# Patient Record
Sex: Female | Born: 1955 | Race: White | Hispanic: No | Marital: Married | Smoking: Former smoker
Health system: Southern US, Community
[De-identification: ages and names within clinical notes are randomized; demographics above are authoritative.]

## PROBLEM LIST (undated history)

## (undated) DIAGNOSIS — N809 Endometriosis, unspecified: Secondary | ICD-10-CM

## (undated) DIAGNOSIS — R87619 Unspecified abnormal cytological findings in specimens from cervix uteri: Secondary | ICD-10-CM

## (undated) DIAGNOSIS — I10 Essential (primary) hypertension: Secondary | ICD-10-CM

## (undated) DIAGNOSIS — N8003 Adenomyosis of the uterus: Secondary | ICD-10-CM

## (undated) DIAGNOSIS — E785 Hyperlipidemia, unspecified: Secondary | ICD-10-CM

## (undated) DIAGNOSIS — K219 Gastro-esophageal reflux disease without esophagitis: Secondary | ICD-10-CM

## (undated) DIAGNOSIS — D649 Anemia, unspecified: Secondary | ICD-10-CM

## (undated) DIAGNOSIS — IMO0002 Reserved for concepts with insufficient information to code with codable children: Secondary | ICD-10-CM

## (undated) DIAGNOSIS — M1990 Unspecified osteoarthritis, unspecified site: Secondary | ICD-10-CM

## (undated) DIAGNOSIS — K5790 Diverticulosis of intestine, part unspecified, without perforation or abscess without bleeding: Secondary | ICD-10-CM

## (undated) DIAGNOSIS — R079 Chest pain, unspecified: Secondary | ICD-10-CM

## (undated) DIAGNOSIS — Z9889 Other specified postprocedural states: Secondary | ICD-10-CM

## (undated) DIAGNOSIS — R112 Nausea with vomiting, unspecified: Secondary | ICD-10-CM

## (undated) DIAGNOSIS — N800 Endometriosis of uterus: Secondary | ICD-10-CM

## (undated) DIAGNOSIS — E119 Type 2 diabetes mellitus without complications: Secondary | ICD-10-CM

## (undated) DIAGNOSIS — C801 Malignant (primary) neoplasm, unspecified: Secondary | ICD-10-CM

## (undated) DIAGNOSIS — R32 Unspecified urinary incontinence: Secondary | ICD-10-CM

## (undated) DIAGNOSIS — G709 Myoneural disorder, unspecified: Secondary | ICD-10-CM

## (undated) DIAGNOSIS — T7840XA Allergy, unspecified, initial encounter: Secondary | ICD-10-CM

## (undated) HISTORY — PX: NOSE SURGERY: SHX723

## (undated) HISTORY — DX: Nausea with vomiting, unspecified: R11.2

## (undated) HISTORY — DX: Type 2 diabetes mellitus without complications: E11.9

## (undated) HISTORY — DX: Malignant (primary) neoplasm, unspecified: C80.1

## (undated) HISTORY — DX: Adenomyosis of the uterus: N80.03

## (undated) HISTORY — PX: COLONOSCOPY: SHX174

## (undated) HISTORY — DX: Endometriosis of uterus: N80.0

## (undated) HISTORY — DX: Endometriosis, unspecified: N80.9

## (undated) HISTORY — DX: Gastro-esophageal reflux disease without esophagitis: K21.9

## (undated) HISTORY — DX: Reserved for concepts with insufficient information to code with codable children: IMO0002

## (undated) HISTORY — DX: Allergy, unspecified, initial encounter: T78.40XA

## (undated) HISTORY — DX: Myoneural disorder, unspecified: G70.9

## (undated) HISTORY — DX: Unspecified urinary incontinence: R32

## (undated) HISTORY — DX: Hyperlipidemia, unspecified: E78.5

## (undated) HISTORY — PX: TUMOR REMOVAL: SHX12

## (undated) HISTORY — DX: Diverticulosis of intestine, part unspecified, without perforation or abscess without bleeding: K57.90

## (undated) HISTORY — DX: Other specified postprocedural states: Z98.890

## (undated) HISTORY — DX: Essential (primary) hypertension: I10

## (undated) HISTORY — DX: Unspecified abnormal cytological findings in specimens from cervix uteri: R87.619

## (undated) HISTORY — DX: Unspecified osteoarthritis, unspecified site: M19.90

## (undated) HISTORY — DX: Chest pain, unspecified: R07.9

---
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
## 1977-06-21 HISTORY — PX: TUMOR REMOVAL: SHX12

## 1994-06-21 HISTORY — PX: TUBAL LIGATION: SHX77

## 1998-09-17 ENCOUNTER — Other Ambulatory Visit: Admission: RE | Admit: 1998-09-17 | Discharge: 1998-09-17 | Payer: Self-pay | Admitting: *Deleted

## 1999-09-17 ENCOUNTER — Other Ambulatory Visit: Admission: RE | Admit: 1999-09-17 | Discharge: 1999-09-17 | Payer: Self-pay | Admitting: *Deleted

## 1999-09-24 ENCOUNTER — Ambulatory Visit (HOSPITAL_COMMUNITY): Admission: RE | Admit: 1999-09-24 | Discharge: 1999-09-24 | Payer: Self-pay | Admitting: *Deleted

## 2001-06-15 ENCOUNTER — Other Ambulatory Visit: Admission: RE | Admit: 2001-06-15 | Discharge: 2001-06-15 | Payer: Self-pay | Admitting: Gynecology

## 2002-11-30 ENCOUNTER — Encounter: Payer: Self-pay | Admitting: Gynecology

## 2002-11-30 ENCOUNTER — Ambulatory Visit (HOSPITAL_COMMUNITY): Admission: RE | Admit: 2002-11-30 | Discharge: 2002-11-30 | Payer: Self-pay | Admitting: Gynecology

## 2004-03-18 ENCOUNTER — Other Ambulatory Visit: Admission: RE | Admit: 2004-03-18 | Discharge: 2004-03-18 | Payer: Self-pay | Admitting: Gynecology

## 2004-05-21 HISTORY — PX: VAGINAL HYSTERECTOMY: SUR661

## 2004-05-26 ENCOUNTER — Encounter (INDEPENDENT_AMBULATORY_CARE_PROVIDER_SITE_OTHER): Payer: Self-pay | Admitting: Specialist

## 2004-05-26 ENCOUNTER — Observation Stay (HOSPITAL_COMMUNITY): Admission: RE | Admit: 2004-05-26 | Discharge: 2004-05-27 | Payer: Self-pay | Admitting: Gynecology

## 2006-02-25 ENCOUNTER — Ambulatory Visit (HOSPITAL_COMMUNITY): Admission: RE | Admit: 2006-02-25 | Discharge: 2006-02-25 | Payer: Self-pay | Admitting: *Deleted

## 2010-02-14 ENCOUNTER — Inpatient Hospital Stay (HOSPITAL_COMMUNITY): Admission: EM | Admit: 2010-02-14 | Discharge: 2010-02-17 | Payer: Self-pay | Admitting: Emergency Medicine

## 2010-02-16 HISTORY — PX: CARDIAC CATHETERIZATION: SHX172

## 2010-09-04 LAB — CBC
HCT: 37.6 % (ref 36.0–46.0)
HCT: 38.4 % (ref 36.0–46.0)
HCT: 40.6 % (ref 36.0–46.0)
HCT: 40.9 % (ref 36.0–46.0)
Hemoglobin: 12.7 g/dL (ref 12.0–15.0)
Hemoglobin: 12.8 g/dL (ref 12.0–15.0)
Hemoglobin: 13.6 g/dL (ref 12.0–15.0)
Hemoglobin: 13.7 g/dL (ref 12.0–15.0)
MCH: 28 pg (ref 26.0–34.0)
MCH: 28.1 pg (ref 26.0–34.0)
MCH: 28.2 pg (ref 26.0–34.0)
MCH: 29 pg (ref 26.0–34.0)
MCHC: 33.3 g/dL (ref 30.0–36.0)
MCHC: 33.3 g/dL (ref 30.0–36.0)
MCHC: 33.7 g/dL (ref 30.0–36.0)
MCHC: 33.8 g/dL (ref 30.0–36.0)
MCV: 83.6 fL (ref 78.0–100.0)
MCV: 84.2 fL (ref 78.0–100.0)
MCV: 84.4 fL (ref 78.0–100.0)
MCV: 85.8 fL (ref 78.0–100.0)
Platelets: 234 10*3/uL (ref 150–400)
Platelets: 238 10*3/uL (ref 150–400)
Platelets: 241 10*3/uL (ref 150–400)
Platelets: 266 10*3/uL (ref 150–400)
RBC: 4.5 MIL/uL (ref 3.87–5.11)
RBC: 4.55 MIL/uL (ref 3.87–5.11)
RBC: 4.73 MIL/uL (ref 3.87–5.11)
RBC: 4.86 MIL/uL (ref 3.87–5.11)
RDW: 13.1 % (ref 11.5–15.5)
RDW: 13.1 % (ref 11.5–15.5)
RDW: 13.1 % (ref 11.5–15.5)
RDW: 13.2 % (ref 11.5–15.5)
WBC: 10.5 10*3/uL (ref 4.0–10.5)
WBC: 8.8 10*3/uL (ref 4.0–10.5)
WBC: 8.8 10*3/uL (ref 4.0–10.5)
WBC: 9.5 10*3/uL (ref 4.0–10.5)

## 2010-09-04 LAB — BASIC METABOLIC PANEL
BUN: 11 mg/dL (ref 6–23)
BUN: 14 mg/dL (ref 6–23)
BUN: 15 mg/dL (ref 6–23)
CO2: 24 mEq/L (ref 19–32)
CO2: 28 mEq/L (ref 19–32)
CO2: 28 mEq/L (ref 19–32)
Calcium: 9.1 mg/dL (ref 8.4–10.5)
Calcium: 9.1 mg/dL (ref 8.4–10.5)
Calcium: 9.3 mg/dL (ref 8.4–10.5)
Chloride: 105 mEq/L (ref 96–112)
Chloride: 106 mEq/L (ref 96–112)
Chloride: 107 mEq/L (ref 96–112)
Creatinine, Ser: 0.82 mg/dL (ref 0.4–1.2)
Creatinine, Ser: 0.87 mg/dL (ref 0.4–1.2)
Creatinine, Ser: 1 mg/dL (ref 0.4–1.2)
GFR calc Af Amer: >60 mL/min (ref >60)
GFR calc Af Amer: >60 mL/min (ref >60)
GFR calc Af Amer: >60 mL/min (ref >60)
GFR calc non Af Amer: 58 mL/min — ABNORMAL LOW (ref >60)
GFR calc non Af Amer: >60 mL/min (ref >60)
GFR calc non Af Amer: >60 mL/min (ref >60)
Glucose, Bld: 147 mg/dL — ABNORMAL HIGH (ref 70–99)
Glucose, Bld: 149 mg/dL — ABNORMAL HIGH (ref 70–99)
Glucose, Bld: 223 mg/dL — ABNORMAL HIGH (ref 70–99)
Potassium: 3.6 mEq/L (ref 3.5–5.1)
Potassium: 4.1 mEq/L (ref 3.5–5.1)
Potassium: 4.2 mEq/L (ref 3.5–5.1)
Sodium: 138 mEq/L (ref 135–145)
Sodium: 139 mEq/L (ref 135–145)
Sodium: 140 mEq/L (ref 135–145)

## 2010-09-04 LAB — LIPID PANEL
Cholesterol: 215 mg/dL — ABNORMAL HIGH (ref 0–200)
HDL: 50 mg/dL (ref >39)
LDL Cholesterol: 133 mg/dL — ABNORMAL HIGH (ref 0–99)
Total CHOL/HDL Ratio: 4.3 RATIO
Triglycerides: 158 mg/dL — ABNORMAL HIGH (ref <150)
VLDL: 32 mg/dL (ref 0–40)

## 2010-09-04 LAB — DIFFERENTIAL
Basophils Absolute: 0.1 10*3/uL (ref 0.0–0.1)
Basophils Relative: 1 % (ref 0–1)
Eosinophils Absolute: 0.2 10*3/uL (ref 0.0–0.7)
Eosinophils Relative: 2 % (ref 0–5)
Lymphocytes Relative: 36 % (ref 12–46)
Lymphs Abs: 3.4 10*3/uL (ref 0.7–4.0)
Monocytes Absolute: 0.8 10*3/uL (ref 0.1–1.0)
Monocytes Relative: 8 % (ref 3–12)
Neutro Abs: 5 10*3/uL (ref 1.7–7.7)
Neutrophils Relative %: 53 % (ref 43–77)

## 2010-09-04 LAB — BRAIN NATRIURETIC PEPTIDE: Pro B Natriuretic peptide (BNP): <30 pg/mL (ref 0.0–100.0)

## 2010-09-04 LAB — CK TOTAL AND CKMB (NOT AT ARMC)
CK, MB: 3.4 ng/mL (ref 0.3–4.0)
Relative Index: 2 (ref 0.0–2.5)
Total CK: 166 U/L (ref 7–177)

## 2010-09-04 LAB — GLUCOSE, CAPILLARY
Glucose-Capillary: 150 mg/dL — ABNORMAL HIGH (ref 70–99)
Glucose-Capillary: 158 mg/dL — ABNORMAL HIGH (ref 70–99)
Glucose-Capillary: 160 mg/dL — ABNORMAL HIGH (ref 70–99)
Glucose-Capillary: 166 mg/dL — ABNORMAL HIGH (ref 70–99)
Glucose-Capillary: 188 mg/dL — ABNORMAL HIGH (ref 70–99)
Glucose-Capillary: 214 mg/dL — ABNORMAL HIGH (ref 70–99)
Glucose-Capillary: 228 mg/dL — ABNORMAL HIGH (ref 70–99)
Glucose-Capillary: 312 mg/dL — ABNORMAL HIGH (ref 70–99)

## 2010-09-04 LAB — CARDIAC PANEL(CRET KIN+CKTOT+MB+TROPI)
CK, MB: 3.1 ng/mL (ref 0.3–4.0)
CK, MB: 3.3 ng/mL (ref 0.3–4.0)
Relative Index: 1.9 (ref 0.0–2.5)
Relative Index: 2 (ref 0.0–2.5)
Total CK: 156 U/L (ref 7–177)
Total CK: 174 U/L (ref 7–177)
Troponin I: 0.01 ng/mL (ref 0.00–0.06)
Troponin I: <0.01 ng/mL (ref 0.00–0.06)

## 2010-09-04 LAB — POCT CARDIAC MARKERS
CKMB, poc: 1.8 ng/mL (ref 1.0–8.0)
Myoglobin, poc: 62.5 ng/mL (ref 12–200)
Troponin i, poc: <0.05 ng/mL (ref 0.00–0.09)

## 2010-09-04 LAB — TSH: TSH: 3.447 u[IU]/mL (ref 0.350–4.500)

## 2010-09-04 LAB — HEMOGLOBIN A1C
Hgb A1c MFr Bld: 8 % — ABNORMAL HIGH (ref <5.7)
Mean Plasma Glucose: 183 mg/dL — ABNORMAL HIGH (ref <117)

## 2010-09-04 LAB — HEPARIN LEVEL (UNFRACTIONATED)
Heparin Unfractionated: 0.24 IU/mL — ABNORMAL LOW (ref 0.30–0.70)
Heparin Unfractionated: 0.34 IU/mL (ref 0.30–0.70)
Heparin Unfractionated: 0.35 IU/mL (ref 0.30–0.70)

## 2010-09-04 LAB — TROPONIN I: Troponin I: 0.01 ng/mL (ref 0.00–0.06)

## 2010-09-04 LAB — MRSA PCR SCREENING: MRSA by PCR: NEGATIVE

## 2010-09-04 LAB — D-DIMER, QUANTITATIVE: D-Dimer, Quant: 0.29 ug/mL-FEU (ref 0.00–0.48)

## 2010-11-06 NOTE — H&P (Signed)
[name], [name]           ACCOUNT NO.:  [id number]   MEDICAL RECORD NO.:  [id number]          PATIENT TYPE:  AMB   LOCATION:  DAY                          FACILITY:  [facility]   PHYSICIAN:  Gretta Cool, M.D. DATE OF BIRTH:  03-13-1956   DATE OF ADMISSION:  DATE OF DISCHARGE:                                HISTORY & PHYSICAL   CHIEF COMPLAINT:  Abnormal uterine bleeding and cyclic pelvic pain.   HISTORY OF PRESENT ILLNESS:  Ms. Payne is a 55 year old G2 P2,  perimenopausal but not on hormone-replacement therapy.  She has abnormal  uterine bleeding that is increasingly severe.  She has cyclic menses but  bleeding that goes on for a prolonged period of time and very heavy bleeding  the first couple of days of cycle.  She also has a history of tubal  sterilization and has had increasingly severe cyclic pelvic pain since her  tubal sterilization process.  Tubal in 1996, Dr. Roberto Scales, unknown type.  She  has had ultrasound examination of her endometrial cavity that revealed  multiple large cystic areas in the sub endometrial myometrium extending all  the way to the serosa suggestive of extensive adenomyosis.  She has no  evidence of luminal pathology.  She has had endometrial sampling that  revealed proliferative endometrium, no evidence of hyperplasia or more.  She  is now admitted for definitive therapy unwilling to tolerate her symptoms  any longer.  She has failed conservative management and wishes definitive  end to the problem.  We have discussed laparoscopy-assisted hysterectomy,  possible laparotomy, possible total abdominal hysterectomy, bilateral  salpingo-oophorectomy.  She understands the risks, benefits, and alternative  therapies and all.   PAST MEDICAL HISTORY:  Usual childhood disease without sequela.   MEDICAL ILLNESSES:  None.   CHILDHOOD DISEASES:  None.   MEDICAL CONDITIONS:  None.   PREVIOUS HOSPITALIZATIONS:  For tumor removed by Dr. Roberto Scales in 1979,  unknown  site.  Nasal surgery in 1988 Dr. Bernarda Caffey, tubal sterilization in 1996 Dr.  Roberto Scales.   HABITS:  Denies ethanol. Denies tobacco.  Occasional ethanol.   FAMILY HISTORY:  Father died at age 14 of heart concerns, was also  alcoholic.  Mom has emphysema.  No other known familial tendency.  Maternal  grandmother had stomach cancer.   REVIEW OF SYSTEMS:  HEENT:  Denies symptoms.  CARDIORESPIRATORY:  Denies  asthma, cough, bronchitis, stridor or shortness of breath.  GASTROINTESTINAL/GENITOURINARY:  Denies frequency, urgency, dysuria, change  in bowel habits, food intolerance.   PHYSICAL EXAMINATION:  GENERAL:  Well-developed, well-nourished white  female.  HEENT:  Pupils equal, round, reactive to light and accommodation.  Fundi  benign.  Oropharynx clear.  NECK:  Supple without masses or thyroid enlargement.  CHEST:  Clear to percussion and auscultation.  BREASTS:  Without masses, nodes, nipple discharge.  HEART:  A regular rhythm without murmur or cardiac enlargement.  ABDOMEN:  Soft, scaphoid without mass or organomegaly.  PELVIC:  External genitalia normal female.  Vagina:  Clean, rugose.  Cervix  is parous, clean.  Uterus:  Normal size, shape, contour.  Tender to  manipulation.  Adnexa clear.  Rectovaginal:  Confirms.   IMPRESSION:  1.  Abnormal uterine bleeding, probably secondary to adenomyosis.  2.  Cyclic pain secondary to endometriosis or to tubal sterilization with      utero-peritoneal fistula versus adenomyosis.  3.  Depression.  4.  Hypertension.   PLAN:  I have discussed all pertinent options and therapies.  The patient  has chosen laparoscopically-assisted vaginal hysterectomy, possible salpingo-  oophorectomy.  She understands the risks, benefits, and alternative  therapies all.  She is now admitted for definitive therapy.      CWL/MEDQ  D:  05/25/2004  T:  05/25/2004  Job:  [id number]   cc:   Juline Patch, M.D.  [address]  [place], [place]  [address]  Fax: [phone]

## 2010-11-06 NOTE — Op Note (Signed)
[name], [name] NO.:  [id number]   MEDICAL RECORD NO.:  [id number]          PATIENT TYPE:  OBV   LOCATION:  0456                         FACILITY:  [facility]   PHYSICIAN:  Gretta Cool, M.D. DATE OF BIRTH:  03/05/1956   DATE OF PROCEDURE:  05/26/2004  DATE OF DISCHARGE:                                 OPERATIVE REPORT   PREOPERATIVE DIAGNOSES:  1.  Incapacitating cyclic pain.  2.  Abnormal uterine bleeding.   POSTOPERATIVE DIAGNOSES:  1.  Incapacitating cyclic pain.  2.  Abnormal uterine bleeding.   PROCEDURE:  Laparoscopy assisted hysterectomy and bilateral salpingo-  oophorectomy, repair of enterocele.   SURGEON:  Gretta Cool, M.D.   ASSISTANT:  Raynald Kemp, M.D.   ANESTHESIA:  General oral tracheal.   DESCRIPTION OF PROCEDURE:  Under excellent general anesthesia with the  patient prepped and draped in lithotomy position in Beattie stirrups with her  bladder drained by a Foley catheter, a subumbilical incision was made.  After adequate pneumoperitoneum with carbon dioxide, the laparoscope and  trocar was introduced and pelvic organs visualized.  There was a great deal  of intraabdominal fat and visualization of the uterus and ovaries was  difficult. The right ovary was virtually absent, vestigial looking, smaller  than a pea in size. The left ovary had evidence of fairly recent activity  but was diminished in size.  Examination of the tubes revealed previous  tubal sterilization near the cornual area of the uterus. There was no  significant evidence of endometriosis or other cause for cyclic pelvic pain.  At this point, accessory ports were placed and the pelvic organs were  manipulated. The uterus was manipulated side to side to allow gyrus tripolar  forceps to be used to cauterize the infundibulopelvic vessels. The vessels  were then transected. The round was then likewise treated by cautery and  then transection. The transection was  carried down to the level of the  uterine vessels on each side. At this point, attention was turned to the  vaginal portion of the procedure. The patient was repositioned and the  cervix grasped with a single tooth tenaculum.  It was then infiltrated with  Xylocaine 1% with epinephrine 1:200,000. The mucosa was then incised and the  bladder pushed back off the lower segment. The cul-de-sac was then entered  and the uterosacral and cardinal ligaments then progressively clamped, cut,  sutured and tied with #0 Vicryl.  At this point, the vesicovaginal plica was  opened and a Deaver placed beneath the bladder. The uterine vessels were  then clamped, cut, sutured and tied with #0 Vicryl. At this point, the upper  portion of the laparoscopic procedure was communicated to the vaginal  portion of the procedure and the pedicle tied by #0 Vicryl.  At this point,  the uterus was removed along with the vestigial right ovary and left ovary  and tube. At this point, the pelvis is irrigated with lactated Ringer's.  A  cardinal uterosacral colposuspension was performed using 2-0 Novofil.  The  fascial defect of the posterior vaginal wall was then reapproximated to the  uterosacral cardinal plication so as to elevate the fascial detachment and  to bring the entire layer of fascia back to the level of the closure at the  anterior vaginal wall. At this point, the vaginal cuff was closed with a  subcuticular closure of #0 Vicryl from angle to angle with running suture of  #0 Vicryl.  At this point, the sponge and lap counts are correct, there are  no complications. The vaginal portion of the procedure was terminated and  attention again turned to the abdominal procedure. The pedicles were  visualized, there was no bleeding.  The pelvis is irrigated with lactated  Ringer's and the gas then allowed to escape. The incision was closed with  deep suture of #0 Vicryl UR-6 and subcuticular 5-0 Vicryl, skin closure  with  Steri-Strips.  At the end of the procedure, sponge and lap counts were  correct with no complications.  The patient returned to the recovery room in  excellent condition.      CWL/MEDQ  D:  05/26/2004  T:  05/26/2004  Job:  [id number]

## 2010-11-06 NOTE — Op Note (Signed)
NAMEKYNZLI, REASE NO.:  [id number]   MEDICAL RECORD NO.:  [id number]          PATIENT TYPE:  AMB   LOCATION:  ENDO                         FACILITY:  [facility]   PHYSICIAN:  Georgiana Spinner, M.D.    DATE OF BIRTH:  [birth year]   DATE OF PROCEDURE:  02/25/2006  DATE OF DISCHARGE:                                 OPERATIVE REPORT   PROCEDURE:  Colonoscopy.   INDICATIONS:  Colon cancer screening.  The patient's insurance company,  [facility] refused to cover endoscopy for Barrett's esophagus  screening.   ANESTHESIA:  Fentanyl 50 mcg, Versed 15 mg.   PROCEDURE:  With the patient mildly sedated in the left lateral decubitus  position,  the Olympus videoscopic colonoscope was inserted into the rectum,  passed under direct vision to the cecum identified by appendiceal orifice  and ileocecal valve both of which were photographed.  From this point the  colonoscope was then slowly withdrawn taking circumferential views of  colonic mucosa stopping only in the rectum which appeared normal on direct  showed hemorrhoids on retroflexed view the endoscope was straightened and  withdrawn.  The patient's vital signs, pulse oximeter remained stable.  The  patient tolerated procedure well without apparent complication.   FINDINGS:  Internal hemorrhoids otherwise an unremarkable colonoscopic  examination to the cecum.   PLAN:  Consider repeat examination in 5-10 years           ______________________________  Georgiana Spinner, M.D.     GMO/MEDQ  D:  02/25/2006  T:  02/25/2006  Job:  [id number]

## 2012-08-22 ENCOUNTER — Encounter: Payer: Self-pay | Admitting: *Deleted

## 2012-09-18 ENCOUNTER — Encounter: Payer: Self-pay | Admitting: Cardiovascular Disease

## 2014-03-19 ENCOUNTER — Ambulatory Visit (INDEPENDENT_AMBULATORY_CARE_PROVIDER_SITE_OTHER): Payer: Managed Care, Other (non HMO) | Admitting: Certified Nurse Midwife

## 2014-03-19 ENCOUNTER — Encounter: Payer: Self-pay | Admitting: Certified Nurse Midwife

## 2014-03-19 VITALS — BP 100/78 | HR 70 | Resp 16 | Ht 65.0 in | Wt 213.0 lb

## 2014-03-19 DIAGNOSIS — N762 Acute vulvitis: Secondary | ICD-10-CM

## 2014-03-19 DIAGNOSIS — Z01419 Encounter for gynecological examination (general) (routine) without abnormal findings: Secondary | ICD-10-CM

## 2014-03-19 DIAGNOSIS — Z0000 Encounter for general adult medical examination without abnormal findings: Secondary | ICD-10-CM

## 2014-03-19 DIAGNOSIS — B3731 Acute candidiasis of vulva and vagina: Secondary | ICD-10-CM

## 2014-03-19 DIAGNOSIS — B373 Candidiasis of vulva and vagina: Secondary | ICD-10-CM

## 2014-03-19 DIAGNOSIS — N760 Acute vaginitis: Secondary | ICD-10-CM

## 2014-03-19 MED ORDER — FLUCONAZOLE 150 MG PO TABS: 150.0000 mg | ORAL_TABLET | Freq: Once | ORAL | Status: DC

## 2014-03-19 MED ORDER — NYSTATIN-TRIAMCINOLONE 100000-0.1 UNIT/GM-% EX OINT: 1.0000 "application " | TOPICAL_OINTMENT | Freq: Two times a day (BID) | CUTANEOUS | Status: DC

## 2014-03-19 NOTE — Progress Notes (Signed)
58 y.o. [id number] Married Caucasian Fe here to establish gyn care and  for annual exam. Menopausal no hot flashes or night sweats. Patient having vaginal dryness for the past 2 years which has increased. Notes blood occasionally with sexual activity. Family history of cardiac bypass(sister) patient had cardiac catheterization which was normal. Patient having some vaginal and vulva issues that sometimes causing burning with urination for the past few weeks. No urinary frequency or pain urination. Denies back pain or blood in urine. History of total vaginal hysterectomy including ovaries which per patient were attached to uterus due to endometrosis. Patient denies history of HSV, except oral and spouse has same. No outbreaks in genital area ever. Patient is Type 2 Diabetic on oral medication and hypertension controlled with medication with Dr. Minna Antis. Visit due with Dr.Pang. No other health issues today  Patient's last menstrual period was 05/21/2004.          Sexually active: Yes.    The current method of family planning is status post hysterectomy.    Exercising: Yes.    walking Smoker:  no  Health Maintenance: Pap:  2006 Abnormal yrs ago MMG: 2010 over due does SBE Colonoscopy:  2007 negative 10 years PCP manages BMD:   2005 normal per patient TDaP: ? UPTD Labs: Poct urine-neg Self breast exam: done occ   reports that she has quit smoking. She does not have any smokeless tobacco history on file. She reports that she drinks about .6 ounces of alcohol per week. She reports that she does not use illicit drugs.  Past Medical History  Diagnosis Date  . DM (diabetes mellitus)   . Hypertension   . Hyperlipemia   . Chest pain   . Abnormal Pap smear of cervix     yrs ago  . Dyspareunia   . Endometriosis   . Adenomyosis   . Urinary incontinence     Past Surgical History  Procedure Laterality Date  . Cardiac catheterization  02/16/10    normal coronaries, normal LV function,EF >55%  . Vaginal  hysterectomy  12/05  . Tubal ligation  1996  . Tumor removal      ovary  . Nose surgery      Current Outpatient Prescriptions  Medication Sig Dispense Refill  . aspirin 81 MG tablet Take 81 mg by mouth as needed.       . metoprolol succinate (TOPROL-XL) 50 MG 24 hr tablet Take 50 mg by mouth daily. Take with or immediately following a meal.      . sitaGLIPtin-metformin (JANUMET) 50-1000 MG per tablet Take 1 tablet by mouth 2 (two) times daily with a meal.       No current facility-administered medications for this visit.    Family History  Problem Relation Age of Onset  . Heart attack Father   . Hyperlipidemia Father   . Diabetes Sister   . Hypertension Sister   . Hyperlipidemia Sister   . Heart disease Sister     bypass  . Cancer Maternal Grandmother     cervical & stomach    ROS:  Pertinent items are noted in HPI.  Otherwise, a comprehensive ROS was negative.  Exam:   BP 100/78  Pulse 70  Resp 16  Ht 5\' 5"  (1.651 m)  Wt 213 lb (96.616 kg)  BMI 35.44 kg/m2  LMP 05/21/2004 Height: 5\' 5"  (165.1 cm)  Ht Readings from Last 3 Encounters:  03/19/14 5\' 5"  (1.651 m)    General appearance: alert, cooperative and  appears stated age Head: Normocephalic, without obvious abnormality, atraumatic Neck: no adenopathy, supple, symmetrical, trachea midline and thyroid normal to inspection and palpation Lungs: clear to auscultation bilaterally Breasts: normal appearance, no masses or tenderness, No nipple retraction or dimpling, No nipple discharge or bleeding, No axillary or supraclavicular adenopathy, large pendulous Heart: regular rate and rhythm Abdomen: soft, non-tender; no masses,  no organomegaly Extremities: extremities normal, atraumatic, no cyanosis or edema Skin: Skin color, texture, turgor normal. No rashes or lesions Lymph nodes: Cervical, supraclavicular, and axillary nodes normal. No abnormal inguinal nodes palpated Neurologic: Grossly normal   Pelvic: External  genitalia:  Increase pink noted on labia and small crusted over area at top of clitoral hood, with ? HSV healing blisters. Culture taken and wet prep take               Urethra:  normal appearing urethra with no masses, tenderness or lesions              Bartholin's and Skene's: normal                 Vagina: atrophic appearing vagina with normal color and discharge, no lesions, wet prep taken, ph 4.5              Cervix: absent              Pap taken: No. Bimanual Exam:  Uterus:  uterus absent              Adnexa: no mass, fullness, tenderness and adnexal not palpated bilateral, per patient absent               Rectovaginal: Confirms               Anus:  normal sphincter tone, no lesions  A:  Well Woman with normal exam  Menopausal  No HRT S/P TVH with ovaries attached per patient? Due severe endometrosis  Type 2 Diabetic on oral med/ Hypertension on oral med with PCP management  Yeast vaginitis/vulvitis  R/O HSV genital area history of oral HSV    P:   Reviewed health and wellness pertinent to exam  Reviewed findings of yeast vaginitis/vulvitis and need for treatment. Discussed aveeno bath for comfort and decrease wearing pads to decrease risk of irritation. Questions addressed at length.  Discussed vaginal moisture to decrease discomfort with sexual activity. OTc products discussed. Will reassess area in 1 1/2 weeks and discuss again.  Rx Diflucan see order  Rx Mycolog ointment see order  Pap smear not taken today   counseled on breast self exam, mammography screening, menopause, adequate intake of calcium and vitamin D, diet and exercise, Kegel's exercises   return annually or prn as above  An After Visit Summary was printed and given to the patient.

## 2014-03-19 NOTE — Patient Instructions (Signed)

## 2014-03-20 NOTE — Progress Notes (Signed)
Encounter reviewed by Dr. Yassine Brunsman Silva.  

## 2014-03-21 ENCOUNTER — Telehealth: Payer: Self-pay | Admitting: Certified Nurse Midwife

## 2014-03-21 LAB — HERPES SIMPLEX VIRUS CULTURE: Organism ID, Bacteria: NOT DETECTED

## 2014-03-21 NOTE — Telephone Encounter (Signed)
Patient states she is returning call to [facility] about results but no telephone note opened.

## 2014-03-21 NOTE — Telephone Encounter (Signed)
Spoke with patient. Advised of message as seen below from [place]. Patient is agreeable and verbalizes understanding.   Notes Recorded by Danie Binder, CMA on 03/21/2014 at 2:48 PM Message giving results left on patients voicemail. Message said that her lab results are normal. No detailed test given. Release signed [phone] (Home)  Notes Recorded by Regina Eck, CNM on 03/21/2014 at 1:30 PM Notify patient herpes culture negative

## 2014-03-29 ENCOUNTER — Ambulatory Visit (INDEPENDENT_AMBULATORY_CARE_PROVIDER_SITE_OTHER): Payer: Managed Care, Other (non HMO) | Admitting: Certified Nurse Midwife

## 2014-03-29 ENCOUNTER — Encounter: Payer: Self-pay | Admitting: Certified Nurse Midwife

## 2014-03-29 VITALS — BP 120/80 | HR 72 | Resp 16 | Wt 212.0 lb

## 2014-03-29 DIAGNOSIS — B3731 Acute candidiasis of vulva and vagina: Secondary | ICD-10-CM

## 2014-03-29 DIAGNOSIS — B373 Candidiasis of vulva and vagina: Secondary | ICD-10-CM

## 2014-03-29 LAB — POCT URINALYSIS DIPSTICK
Bilirubin, UA: NEGATIVE
Blood, UA: NEGATIVE
Glucose, UA: NEGATIVE
Ketones, UA: NEGATIVE
Leukocytes, UA: NEGATIVE
Nitrite, UA: NEGATIVE
Protein, UA: NEGATIVE
Urobilinogen, UA: NEGATIVE

## 2014-03-29 NOTE — Progress Notes (Signed)
58 y.o. married white female [id number] here for follow up of yeast vaginitis treated with Diflucan and Mycolog Ointment. Completed all medication. Denies symptoms of vaginal  itching, burning, and increase discharge. Patient feeling" 100 % better". Patient Aveeno bath also for comfort and felt it worked great.  O:Healthy female WDWN Affect: normal, orientation x 3 HSV culture negative  Exam:  Lymph node: no enlargement or tenderness Pelvic exam: External genital: no redness, no exudate, no scaling  Or crusty area noted. BUS: negative Vagina: normal vaginal discharge noted. Ph: 4.0  ,Wet prep taken Cervix: absent Uterus: absent Adnexa:absent, no masses or fullness noted   Wet Prep results: negative   A:Yeast vaginitis and vulvitis resolved Type 2 diabetes not checking her glucose, suspect she is not in good control.   P:Discussed findings of yeast resolved. She can now start using Coconut oil for dryness as we discussed for sexual activity and comfort. Discussed checking her blood glucose more frequently to decrease risk of yeast reoccurrence. Patient plans to start again.  Rv prn

## 2014-03-29 NOTE — Progress Notes (Signed)
Reviewed personally.  M. Suzanne Reverie Vaquera, MD.  

## 2014-04-22 ENCOUNTER — Encounter: Payer: Self-pay | Admitting: Certified Nurse Midwife

## 2014-06-18 ENCOUNTER — Telehealth: Payer: Self-pay | Admitting: Certified Nurse Midwife

## 2014-06-18 NOTE — Telephone Encounter (Signed)
Patient is asking to talk with billing regarding her statement.

## 2014-06-18 NOTE — Telephone Encounter (Signed)
Pt calling regarding a bill.

## 2014-06-25 ENCOUNTER — Telehealth: Payer: Self-pay | Admitting: Certified Nurse Midwife

## 2014-06-25 NOTE — Telephone Encounter (Signed)
I added this message to her account note.

## 2014-06-25 NOTE — Telephone Encounter (Signed)
Pt says her insurance company is refiling claim and hopefully she will know something by the end of week.

## 2016-08-17 ENCOUNTER — Other Ambulatory Visit: Payer: Self-pay | Admitting: Internal Medicine

## 2016-08-17 DIAGNOSIS — K5790 Diverticulosis of intestine, part unspecified, without perforation or abscess without bleeding: Secondary | ICD-10-CM

## 2016-08-17 DIAGNOSIS — R109 Unspecified abdominal pain: Secondary | ICD-10-CM

## 2016-08-19 ENCOUNTER — Ambulatory Visit
Admission: RE | Admit: 2016-08-19 | Discharge: 2016-08-19 | Disposition: A | Discharge status: Home / Self Care | Payer: Managed Care, Other (non HMO) | Source: Ambulatory Visit | Attending: Internal Medicine | Admitting: Internal Medicine

## 2016-08-19 DIAGNOSIS — R109 Unspecified abdominal pain: Secondary | ICD-10-CM

## 2016-08-19 DIAGNOSIS — K5790 Diverticulosis of intestine, part unspecified, without perforation or abscess without bleeding: Secondary | ICD-10-CM

## 2016-08-19 MED ORDER — IOPAMIDOL (ISOVUE-300) INJECTION 61%
125.0000 mL | Freq: Once | INTRAVENOUS | Status: AC | PRN
  Administered 2016-08-19: 125 mL via INTRAVENOUS

## 2016-08-20 ENCOUNTER — Other Ambulatory Visit: Payer: Self-pay | Admitting: Internal Medicine

## 2016-08-20 DIAGNOSIS — D1803 Hemangioma of intra-abdominal structures: Secondary | ICD-10-CM

## 2016-08-31 ENCOUNTER — Ambulatory Visit
Admission: RE | Admit: 2016-08-31 | Discharge: 2016-08-31 | Disposition: A | Discharge status: Home / Self Care | Payer: Managed Care, Other (non HMO) | Source: Ambulatory Visit | Attending: Internal Medicine | Admitting: Internal Medicine

## 2016-08-31 DIAGNOSIS — D1803 Hemangioma of intra-abdominal structures: Secondary | ICD-10-CM

## 2016-08-31 MED ORDER — GADOBENATE DIMEGLUMINE 529 MG/ML IV SOLN
19.0000 mL | Freq: Once | INTRAVENOUS | Status: AC | PRN
  Administered 2016-08-31: 19 mL via INTRAVENOUS

## 2016-09-23 ENCOUNTER — Encounter: Payer: Self-pay | Admitting: Gastroenterology

## 2016-11-12 ENCOUNTER — Encounter: Payer: Self-pay | Admitting: Internal Medicine

## 2016-11-19 ENCOUNTER — Encounter: Payer: Managed Care, Other (non HMO) | Admitting: Gastroenterology

## 2018-05-09 ENCOUNTER — Other Ambulatory Visit: Payer: Self-pay | Admitting: Internal Medicine

## 2018-05-09 DIAGNOSIS — Z1231 Encounter for screening mammogram for malignant neoplasm of breast: Secondary | ICD-10-CM

## 2018-06-26 ENCOUNTER — Ambulatory Visit
Admission: RE | Admit: 2018-06-26 | Discharge: 2018-06-26 | Disposition: A | Discharge status: Home / Self Care | Payer: 59 | Source: Ambulatory Visit | Attending: Internal Medicine | Admitting: Internal Medicine

## 2018-06-26 DIAGNOSIS — Z1231 Encounter for screening mammogram for malignant neoplasm of breast: Secondary | ICD-10-CM

## 2018-11-17 ENCOUNTER — Other Ambulatory Visit: Payer: Self-pay

## 2018-11-17 ENCOUNTER — Encounter: Payer: Self-pay | Admitting: Cardiology

## 2018-11-17 ENCOUNTER — Ambulatory Visit: Payer: 59 | Admitting: Cardiology

## 2018-11-17 VITALS — BP 160/89 | HR 84 | Ht 65.0 in | Wt 205.2 lb

## 2018-11-17 DIAGNOSIS — R0600 Dyspnea, unspecified: Secondary | ICD-10-CM | POA: Insufficient documentation

## 2018-11-17 DIAGNOSIS — R0789 Other chest pain: Secondary | ICD-10-CM

## 2018-11-17 DIAGNOSIS — R0609 Other forms of dyspnea: Secondary | ICD-10-CM | POA: Diagnosis not present

## 2018-11-17 NOTE — Progress Notes (Signed)
Patient referred by [name], MD for chest tightness  Subjective:   [name], female    DOB: 11/17/1955, 63 y.o.   MRN: [id number]   Chief Complaint  Patient presents with  . Chest Pain  . New Patient (Initial Visit)     HPI  63 y.o. Caucasian female with hypertension, hyperlipidemia, [name], mild obesity, with chest tightness, shortness of breath.  Patient stays busy with work. Although she does not do any physical exercise. She has noticed occasional left sided chest tightness that only lasts for few seconds, unrelated to physical exertion. However, she has noticed that sh gets out of breath easily after climbing a flight of stairs. She denies palpitations, presyncope,. syncope,e orthopnea, PND, leg edema. She is working diligently with her PCP and diabetes specialist with significant improvement in her A1C. BP is high today, but usually normal.   Past Medical History:  Diagnosis Date  . Abnormal Pap smear of cervix    yrs ago  . Adenomyosis   . Chest pain   . DM (diabetes mellitus) ([place])   . Dyspareunia   . Endometriosis   . Hyperlipemia   . Hypertension   . Urinary incontinence      Past Surgical History:  Procedure Laterality Date  . CARDIAC CATHETERIZATION  02/16/10   normal coronaries, normal LV function,EF >55%  . NOSE SURGERY    . TUBAL LIGATION  1996  . TUMOR REMOVAL     [place]  . VAGINAL HYSTERECTOMY  12/05     Social History   Socioeconomic History  . Marital status: Married    Spouse name: Not on file  . Number of children: Not on file  . Years of education: Not on file  . Highest education level: Not on file  Occupational History  . Not on file  Social Needs  . Financial resource strain: Not on file  . Food insecurity:    Worry: Not on file    Inability: Not on file  . Transportation needs:    Medical: Not on file    Non-medical: Not on file  Tobacco Use  . Smoking status: Former Research scientist (life sciences)  . Smokeless  tobacco: Never Used  Substance and Sexual Activity  . Alcohol use: Yes    Alcohol/week: 1.0 standard drinks    Types: 1 Glasses of wine per week    Comment:  social only  . Drug use: No  . Sexual activity: Yes    Partners: Male    Birth control/protection: Surgical    Comment: hysterectomy  Lifestyle  . Physical activity:    Days per week: Not on file    Minutes per session: Not on file  . Stress: Not on file  Relationships  . Social connections:    Talks on phone: Not on file    Gets together: Not on file    Attends religious service: Not on file    Active member of club or organization: Not on file    Attends meetings of clubs or organizations: Not on file    Relationship status: Not on file  . Intimate partner violence:    Fear of current or ex partner: Not on file    Emotionally abused: Not on file    Physically abused: Not on file    Forced sexual activity: Not on file  Other Topics Concern  . Not on file  Social History Narrative  . Not on file     Family History  Problem Relation Age of Onset  . Heart attack Father   . Hyperlipidemia Father   . Hypertension Sister   . Heart disease Sister        bypass  . Cancer Maternal Grandmother        cervical & stomach  . Hyperlipidemia Sister   . Diabetes Sister      Current Outpatient Medications on File Prior to Visit  Medication Sig Dispense Refill  . aspirin 81 MG tablet Take 81 mg by mouth as needed.     . metoprolol succinate (TOPROL-XL) 50 MG 24 hr tablet Take 50 mg by mouth daily. Take with or immediately following a meal.    . sitaGLIPtin-metformin (JANUMET) 50-1000 MG per tablet Take 1 tablet by mouth 2 (two) times daily with a meal.     No current facility-administered medications on file prior to visit.     Cardiovascular studies:  EKG 11/17/2018: Sinus rhythm 71 bpm.  Normal EKG.   Recent labs: 10/19/2018: Glucose 103. BUN/Cr 18/0.7. eGFR 86. Na/K ?/4.Marland Kitchen H/H 13/39. MCV 86. Platelets 266.  Chol 15, TG 72, HDL 35, LDL 88   Review of Systems  Constitution: Negative for decreased appetite, malaise/fatigue, weight gain and weight loss.  HENT: Negative for congestion.   Eyes: Negative for visual disturbance.  Cardiovascular: Positive for chest pain and dyspnea on exertion. Negative for leg swelling, palpitations and syncope.  Respiratory: Positive for shortness of breath. Negative for cough.   Endocrine: Negative for cold intolerance.  Hematologic/Lymphatic: Does not bruise/bleed easily.  Skin: Negative for itching and rash.  Musculoskeletal: Negative for myalgias.  Gastrointestinal: Negative for abdominal pain, nausea and vomiting.  Genitourinary: Negative for dysuria.  Neurological: Negative for dizziness and weakness.  Psychiatric/Behavioral: The patient is not nervous/anxious.   All other systems reviewed and are negative.        Vitals:   11/17/18 1446  BP: (!) 160/89  Pulse: 84  SpO2: 96%     Body mass index is 34.15 kg/m. Filed Weights   11/17/18 1446  Weight: 93.1 kg     Objective:   Physical Exam  Constitutional: She is oriented to person, place, and time. She appears well-developed and well-nourished. No distress.  HENT:  Head: Normocephalic and atraumatic.  Eyes: Pupils are equal, round, and reactive to light. Conjunctivae are normal.  Neck: No JVD present.  Cardiovascular: Normal rate, regular rhythm and intact distal pulses.  Pulmonary/Chest: Effort normal and breath sounds normal. She has no wheezes. She has no rales.  Abdominal: Soft. Bowel sounds are normal. There is no rebound.  Musculoskeletal:        General: No edema.  Lymphadenopathy:    She has no cervical adenopathy.  Neurological: She is alert and oriented to person, place, and time. No cranial nerve deficit.  Skin: Skin is warm and dry.  Psychiatric: She has a normal mood and affect.  Nursing note and vitals reviewed.         Assessment & Recommendations:   63 y.o.  Caucasian female with hypertension, hyperlipidemia, [name], mild obesity, with chest tightness, shortness of breath.  Chest pain & shortness of breath: While her chest pain is unlikely to be anginal, exertional dyspnea could be angina equivalent. I will obtain echocardiogram and lexiscan nuclear stress test.  Hypertension: Elevated today, but usually normal. No change made to her antihypertensive therapy. Continue follow up with PCP.  DM: Better controlled now. Conitnue f/u w/PCP   Thank you for referring the patient  to [place]. Please feel free to contact with any questions.  Nigel Mormon, MD [facility] Pager: [phone] Office: [phone] If no answer Cell [phone]

## 2018-11-21 ENCOUNTER — Other Ambulatory Visit: Payer: Self-pay | Admitting: Cardiology

## 2018-11-21 DIAGNOSIS — R0600 Dyspnea, unspecified: Secondary | ICD-10-CM

## 2018-11-21 DIAGNOSIS — R0609 Other forms of dyspnea: Secondary | ICD-10-CM

## 2018-12-18 ENCOUNTER — Other Ambulatory Visit: Payer: Self-pay

## 2018-12-18 ENCOUNTER — Ambulatory Visit (INDEPENDENT_AMBULATORY_CARE_PROVIDER_SITE_OTHER): Payer: 59

## 2018-12-18 DIAGNOSIS — R0600 Dyspnea, unspecified: Secondary | ICD-10-CM

## 2018-12-18 DIAGNOSIS — R0609 Other forms of dyspnea: Secondary | ICD-10-CM

## 2018-12-19 NOTE — Progress Notes (Signed)
Left detailed vm °

## 2019-01-04 ENCOUNTER — Encounter: Payer: Self-pay | Admitting: Cardiology

## 2019-01-04 ENCOUNTER — Other Ambulatory Visit: Payer: Self-pay

## 2019-01-04 ENCOUNTER — Ambulatory Visit (INDEPENDENT_AMBULATORY_CARE_PROVIDER_SITE_OTHER): Payer: 59 | Admitting: Cardiology

## 2019-01-04 ENCOUNTER — Ambulatory Visit (INDEPENDENT_AMBULATORY_CARE_PROVIDER_SITE_OTHER): Payer: 59

## 2019-01-04 VITALS — BP 147/87 | HR 69 | Ht 65.0 in | Wt 207.5 lb

## 2019-01-04 DIAGNOSIS — I10 Essential (primary) hypertension: Secondary | ICD-10-CM | POA: Diagnosis not present

## 2019-01-04 DIAGNOSIS — E785 Hyperlipidemia, unspecified: Secondary | ICD-10-CM

## 2019-01-04 DIAGNOSIS — E782 Mixed hyperlipidemia: Secondary | ICD-10-CM | POA: Insufficient documentation

## 2019-01-04 DIAGNOSIS — R0609 Other forms of dyspnea: Secondary | ICD-10-CM

## 2019-01-04 DIAGNOSIS — R0600 Dyspnea, unspecified: Secondary | ICD-10-CM

## 2019-01-04 HISTORY — DX: Hyperlipidemia, unspecified: E78.5

## 2019-01-04 NOTE — Progress Notes (Signed)
Patient referred by [name], MD for chest tightness  Subjective:   [name], female    DOB: Feb 16, 1956, 63 y.o.   MRN: [id number]   Chief Complaint  Patient presents with  . Shortness of Breath  . Results    echo  . Follow-up     HPI  63 y.o. Caucasian female with hypertension, hyperlipidemia, [name], mild obesity, with chest tightness, shortness of breath.  Patient underwent echocardiogram and stress test, results below. She still has pain on left side, but unrelated to exertion. Shortness of breath has improved. Blood pressure elevated on first check, improved on repeat check.   Past Medical History:  Diagnosis Date  . Abnormal Pap smear of cervix    yrs ago  . Adenomyosis   . Chest pain   . DM (diabetes mellitus) ([place])   . Dyspareunia   . Endometriosis   . Hyperlipemia   . Hypertension   . Urinary incontinence      Past Surgical History:  Procedure Laterality Date  . CARDIAC CATHETERIZATION  02/16/10   normal coronaries, normal LV function,EF >55%  . NOSE SURGERY    . TUBAL LIGATION  1996  . TUMOR REMOVAL     [place]  . VAGINAL HYSTERECTOMY  12/05     Social History   Socioeconomic History  . Marital status: Married    Spouse name: Not on file  . Number of children: 2  . Years of education: Not on file  . Highest education level: Not on file  Occupational History  . Not on file  Social Needs  . Financial resource strain: Not on file  . Food insecurity    Worry: Not on file    Inability: Not on file  . Transportation needs    Medical: Not on file    Non-medical: Not on file  Tobacco Use  . Smoking status: Former Smoker    Packs/day: 0.25    Types: Cigarettes    Quit date: 1995    Years since quitting: 25.5  . Smokeless tobacco: Never Used  Substance and Sexual Activity  . Alcohol use: Yes    Alcohol/week: 1.0 standard drinks    Types: 1 Glasses of wine per week    Comment:  social only  . Drug use: No  .  Sexual activity: Yes    Partners: Male    Birth control/protection: Surgical    Comment: hysterectomy  Lifestyle  . Physical activity    Days per week: Not on file    Minutes per session: Not on file  . Stress: Not on file  Relationships  . Social Herbalist on phone: Not on file    Gets together: Not on file    Attends religious service: Not on file    Active member of club or organization: Not on file    Attends meetings of clubs or organizations: Not on file    Relationship status: Not on file  . Intimate partner violence    Fear of current or ex partner: Not on file    Emotionally abused: Not on file    Physically abused: Not on file    Forced sexual activity: Not on file  Other Topics Concern  . Not on file  Social History Narrative  . Not on file     Family History  Problem Relation Age of Onset  . Heart attack Father   . Hyperlipidemia Father   . Hypertension  Sister   . Heart disease Sister        bypass  . Cancer Maternal Grandmother        cervical & stomach  . Hyperlipidemia Sister   . Diabetes Sister      Current Outpatient Medications on File Prior to Visit  Medication Sig Dispense Refill  . metFORMIN (GLUCOPHAGE) 850 MG tablet Take 850 mg by mouth 2 (two) times daily with a meal.    . aspirin 81 MG tablet Take 81 mg by mouth daily.     Marland Kitchen atorvastatin (LIPITOR) 10 MG tablet Take 10 mg by mouth daily.    . Cholecalciferol (VITAMIN D3) 50 MCG (2000 UT) CHEW Chew by mouth 2 (two) times a day.    . Dulaglutide (TRULICITY) 4.85 IO/2.7OJ SOPN Inject into the skin once a week.    Marland Kitchen LORazepam (ATIVAN) 0.5 MG tablet Take 0.5 mg by mouth as needed for anxiety.    Marland Kitchen losartan (COZAAR) 25 MG tablet Take 25 mg by mouth 2 (two) times a day.    . metoprolol succinate (TOPROL-XL) 50 MG 24 hr tablet Take 50 mg by mouth daily. Take with or immediately following a meal.    . Omega-3 Fatty Acids (FISH OIL) 500 MG CAPS Take by mouth 2 (two) times a day.     No  current facility-administered medications on file prior to visit.     Cardiovascular studies:  Echocardiogram 01/04/2019: Normal LV systolic function with EF 65%. Left ventricle cavity is normal in size. Mild concentric hypertrophy of the left ventricle. Normal global wall motion. Doppler evidence of grade I (impaired) diastolic dysfunction, normal LAP.  Left atrial cavity is mildly dilated. Mild (Grade I) mitral regurgitation. Normal right atrial pressure.   Lexiscan Myoview Stress Test 12/18/2018: Stress EKG is non-diagnostic, as this is pharmacological stress test. RAW images reveal mild breast attenuation artifact in the apical antieor wall. Normal myocardial perfusion. NLVEF 64%. Low risk study.  EKG 11/17/2018: Sinus rhythm 71 bpm.  Normal EKG.   Recent labs: 10/19/2018: Glucose 103. BUN/Cr 18/0.7. eGFR 86. Na/K ?/4.Marland Kitchen H/H 13/39. MCV 86. Platelets 266. Chol 15, TG 72, HDL 35, LDL 88   Review of Systems  Constitution: Negative for decreased appetite, malaise/fatigue, weight gain and weight loss.  HENT: Negative for congestion.   Eyes: Negative for visual disturbance.  Cardiovascular: Positive for chest pain and dyspnea on exertion. Negative for leg swelling, palpitations and syncope.  Respiratory: Positive for shortness of breath. Negative for cough.   Endocrine: Negative for cold intolerance.  Hematologic/Lymphatic: Does not bruise/bleed easily.  Skin: Negative for itching and rash.  Musculoskeletal: Negative for myalgias.  Gastrointestinal: Negative for abdominal pain, nausea and vomiting.  Genitourinary: Negative for dysuria.  Neurological: Negative for dizziness and weakness.  Psychiatric/Behavioral: The patient is not nervous/anxious.   All other systems reviewed and are negative.        Vitals:   01/04/19 1452 01/05/19 1823  BP: (!) 181/94 (!) 147/87  Pulse: 69   SpO2: 94%      Body mass index is 34.53 kg/m. Filed Weights   01/04/19 1452  Weight:  207 lb 8 oz (94.1 kg)     Objective:   Physical Exam  Constitutional: She is oriented to person, place, and time. She appears well-developed and well-nourished. No distress.  HENT:  Head: Normocephalic and atraumatic.  Eyes: Pupils are equal, round, and reactive to light. Conjunctivae are normal.  Neck: No JVD present.  Cardiovascular: Normal rate, regular rhythm  and intact distal pulses.  Pulmonary/Chest: Effort normal and breath sounds normal. She has no wheezes. She has no rales.  Abdominal: Soft. Bowel sounds are normal. There is no rebound.  Musculoskeletal:        General: No edema.  Lymphadenopathy:    She has no cervical adenopathy.  Neurological: She is alert and oriented to person, place, and time. No cranial nerve deficit.  Skin: Skin is warm and dry.  Psychiatric: She has a normal mood and affect.  Nursing note and vitals reviewed.         Assessment & Recommendations:   63 y.o. Caucasian female with hypertension, hyperlipidemia, [name], mild obesity, with chest tightness, shortness of breath.  Chest pain & shortness of breath: Reassuring echocardiogram and stress test. Symptoms unlikely due to obstructive CAD. Consider noncardiac etiology.   Hypertension: Suboptimal control. Will need follow up with PCP.   Nigel Mormon, MD [facility] Pager: [phone] Office: [phone] If no answer Cell [phone]

## 2019-01-05 ENCOUNTER — Encounter: Payer: Self-pay | Admitting: Cardiology

## 2019-01-05 DIAGNOSIS — I10 Essential (primary) hypertension: Secondary | ICD-10-CM | POA: Insufficient documentation

## 2019-02-06 ENCOUNTER — Other Ambulatory Visit: Payer: Self-pay

## 2019-02-06 DIAGNOSIS — Z20822 Contact with and (suspected) exposure to covid-19: Secondary | ICD-10-CM

## 2019-02-07 LAB — NOVEL CORONAVIRUS, NAA: SARS-CoV-2, NAA: NOT DETECTED

## 2019-03-16 ENCOUNTER — Ambulatory Visit: Payer: Managed Care, Other (non HMO) | Admitting: Internal Medicine

## 2019-03-16 ENCOUNTER — Other Ambulatory Visit: Payer: Self-pay

## 2019-03-16 ENCOUNTER — Encounter: Payer: Self-pay | Admitting: Internal Medicine

## 2019-03-16 DIAGNOSIS — R059 Cough, unspecified: Secondary | ICD-10-CM | POA: Insufficient documentation

## 2019-03-16 DIAGNOSIS — R05 Cough: Secondary | ICD-10-CM

## 2019-03-16 LAB — CBC WITH DIFFERENTIAL/PLATELET
Basophils Absolute: 0.1 10*3/uL (ref 0.0–0.1)
Basophils Relative: 0.9 % (ref 0.0–3.0)
Eosinophils Absolute: 0.2 10*3/uL (ref 0.0–0.7)
Eosinophils Relative: 2 % (ref 0.0–5.0)
HCT: 38.5 % (ref 36.0–46.0)
Hemoglobin: 12.9 g/dL (ref 12.0–15.0)
Lymphocytes Relative: 31.8 % (ref 12.0–46.0)
Lymphs Abs: 2.5 10*3/uL (ref 0.7–4.0)
MCHC: 33.6 g/dL (ref 30.0–36.0)
MCV: 86 fl (ref 78.0–100.0)
Monocytes Absolute: 0.7 10*3/uL (ref 0.1–1.0)
Monocytes Relative: 8.2 % (ref 3.0–12.0)
Neutro Abs: 4.6 10*3/uL (ref 1.4–7.7)
Neutrophils Relative %: 57.1 % (ref 43.0–77.0)
Platelets: 249 10*3/uL (ref 150.0–400.0)
RBC: 4.48 Mil/uL (ref 3.87–5.11)
RDW: 13.4 % (ref 11.5–15.5)
WBC: 8 10*3/uL (ref 4.0–10.5)

## 2019-03-16 NOTE — Progress Notes (Signed)
[name] [name], female    DOB: 08-Jun-1956,    MRN: [id number]   Brief patient profile:  63  yowf quit smoking in 1995 with tendency to sinus infections from age [age] under care of [facility] and then [facility] with persistent sense of drainage and need to clear throat year round productive min mucoid sputum some  better with clariton D but disturbs sleep so rarely uses  with a new pattern of cough spring 2020 assoc with tickle beneath L breast resolves with cough minimal mucus so referred to pulmonary clinic 03/16/2019 by Dr   Mellody Life  H/o ACEi cough   H/o ? Laryngospasm induced resp distress 02/02/19  with pulmonary infiltrates that cleared on f/u cxr 03/17/19  ? Asp ? Neg pressure pulmonary edema.     History of Present Illness  03/16/2019  Pulmonary/ 1st office eval/[name]  Chief Complaint  Patient presents with  . Consult - Pulmonary infiltrate per chest xray    Patient stated she had a dry cough for a while. Was tested for Covid which was negative. Back in August believes she breathed something in that caused her throat to closed in August. Radiologist told her there was an inflammation when chest xray was done.   Dyspnea:  Steps at work more difficult x sev months /assoc with  wt gain x 10 lb Cough:  Tickle sensation= chest dicofort, not really really painful or pleuritic,   happens multiple times a day and recurs after vigorous coughing appears to clear it. Sleep: 2 pillows / cough is worse in am but does not wake her up nor does the "chest tickle" SABA use:  ? Helps cough  - not really clear.   No obvious day to day or daytime variability or assoc excess/ purulent sputum or mucus plugs or hemoptysis   chest tightness, subjective wheeze or overt sinus or hb symptoms.   Sleeping most nights  without nocturnal   exacerbation  of respiratory  c/o's or need for noct saba. Also denies any obvious fluctuation of symptoms with weather or environmental changes or other aggravating or  alleviating factors except as outlined above   No unusual exposure hx or h/o childhood pna/ asthma or knowledge of premature birth.  Current Allergies, Complete Past Medical History, Past Surgical History, Family History, and Social History were reviewed in [employer] record.  ROS  The following are not active complaints unless bolded Hoarseness, sore throat, dysphagia, dental problems, itching, sneezing,  nasal congestion or discharge of excess mucus or purulent secretions, ear ache,   fever, chills, sweats, unintended wt loss or wt gain, classically pleuritic or exertional cp,  orthopnea pnd or arm/hand swelling  or leg swelling, presyncope, palpitations, abdominal pain, anorexia, nausea, vomiting, diarrhea  or change in bowel habits or change in bladder habits, change in stools or change in urine, dysuria, hematuria,  rash, arthralgias, visual complaints, headache, numbness, weakness or ataxia or problems with walking or coordination,  change in mood or  memory.                Past Medical History:  Diagnosis Date  . Abnormal Pap smear of cervix    yrs ago  . Adenomyosis   . Chest pain   . DM (diabetes mellitus) ([place])   . Dyspareunia   . Endometriosis   . HLD (hyperlipidemia) 01/04/2019  . Hyperlipemia   . Hypertension   . Urinary incontinence     Outpatient Medications Prior to Visit  Medication  Sig Dispense Refill  . albuterol (VENTOLIN HFA) 108 (90 Base) MCG/ACT inhaler 1 PUFF AS NEEDED EVERY 6 HRS INHALATION 30 DAYS    . aspirin 81 MG tablet Take 81 mg by mouth daily.     Marland Kitchen atorvastatin (LIPITOR) 10 MG tablet Take 10 mg by mouth daily.    . Cholecalciferol (VITAMIN D3) 50 MCG (2000 UT) CHEW Chew by mouth 2 (two) times a day.    . Dulaglutide (TRULICITY) A999333 0000000 SOPN Inject into the skin once a week.    Marland Kitchen LORazepam (ATIVAN) 0.5 MG tablet Take 0.5 mg by mouth as needed for anxiety.    Marland Kitchen losartan (COZAAR) 25 MG tablet Take 25 mg by mouth 2 (two)  times a day.    . metFORMIN (GLUCOPHAGE) 850 MG tablet Take 850 mg by mouth 2 (two) times daily with a meal.    . metoprolol succinate (TOPROL-XL) 50 MG 24 hr tablet Take 50 mg by mouth daily. Take with or immediately following a meal.    . Omega-3 Fatty Acids (FISH OIL) 500 MG CAPS Take by mouth 2 (two) times a day.        Objective:     BP 128/82 (BP Location: Left Arm, Patient Position: Sitting, Cuff Size: Normal)   Pulse 69   Temp 98 F (36.7 C)   Ht 5\' 5"  (1.651 m)   Wt 212 lb 6.4 oz (96.3 kg)   LMP 05/21/2004   SpO2 97% Comment: on room air  BMI 35.35 kg/m   SpO2: 97 %(on room air)   amb wf nad    HEENT : pt wearing mask not removed for exam due to covid -19 concerns.    NECK :  without JVD/Nodes/TM/ nl carotid upstrokes bilaterally   LUNGS: no acc muscle use,  Nl contour chest which is clear to A and P bilaterally without cough on insp or exp maneuvers   CV:  RRR  no s3 or murmur or increase in P2, and no edema   ABD:  soft and nontender with nl inspiratory excursion in the supine position. No bruits or organomegaly appreciated, bowel sounds nl  MS:  Nl gait/ ext warm without deformities, calf tenderness, cyanosis or clubbing No obvious joint restrictions   SKIN: warm and dry without lesions    NEURO:  alert, approp, nl sensorium with  no motor or cerebellar deficits apparent.    Lab Results  Component Value Date   WBC 8.0 03/16/2019   HGB 12.9 03/16/2019   HCT 38.5 03/16/2019   MCV 86.0 03/16/2019   PLT 249.0 03/16/2019       EOS                                                              0.2                                     03/16/2019    Labs ordered 03/16/2019  :   [facility]  Prior xrays not available at time of eval       Assessment   Cough Onset in 20's assoc with chronic sinus dz  - Sinus CT 03/18/2019 >>>  - HRCT  03/18/2019 >>>    Very difficult to put all of her symptoms together with a single dx so will address her ongoing  issues with chronic daily cough and sensation of a "tickle" under L breast that preceded the event where she may have aspirated (vs laryngospasm)   February 12 2019 > ct sinus and hrct chest (looking for bronchiectasis) are the next steps in terms of a w/u and rec hold off adding new meds emprically at this point / did add gerd diet since reflux is such a common concern in chronic cough (since it can develop from common cough form any source including chronic sinus dz, which her hx clearly suggests,   and produce a cyclical pattern)    Discussed in detail all the  indications, usual  risks and alternatives  relative to the benefits with patient who agrees to proceed with w/u as outlined.      Total time devoted to counseling  > 50 % of initial 60 min office visit:  review case with pt/ discussion of options/alternatives/ personally creating written customized instructions  in presence of pt  then going over those specific  Instructions directly with the pt including how to use all of the meds but in particular covering each new medication in detail and the difference between the maintenance= "automatic" meds and the prns using an action plan format for the latter (If this problem/symptom => do that organization reading Left to right).  Please see AVS from this visit for a full list of these instructions which I personally wrote for this pt and  are unique to this visit.      Christinia Gully, MD 03/16/2019

## 2019-03-16 NOTE — Assessment & Plan Note (Addendum)
Onset in 20's assoc with chronic sinus dz  - Sinus CT 03/18/2019 >>>  - HRCT  03/18/2019 >>>    Very difficult to put all of her symptoms together with a single dx so will address her ongoing issues with chronic daily cough and sensation of a "tickle" under L breast that preceded the event where she may have aspirated (vs laryngospasm)   February 12 2019 > ct sinus and hrct chest (looking for bronchiectasis) are the next steps in terms of a w/u and rec hold off adding new meds emprically at this point / did add gerd diet since reflux is such a common concern in chronic cough (since it can develop from common cough form any source including chronic sinus dz, which her hx clearly suggests,   and produce a cyclical pattern)    Discussed in detail all the  indications, usual  risks and alternatives  relative to the benefits with patient who agrees to proceed with w/u as outlined.      Total time devoted to counseling  > 50 % of initial 60 min office visit:  review case with pt/ discussion of options/alternatives/ personally creating written customized instructions  in presence of pt  then going over those specific  Instructions directly with the pt including how to use all of the meds but in particular covering each new medication in detail and the difference between the maintenance= "automatic" meds and the prns using an action plan format for the latter (If this problem/symptom => do that organization reading Left to right).  Please see AVS from this visit for a full list of these instructions which I personally wrote for this pt and  are unique to this visit.

## 2019-03-16 NOTE — Patient Instructions (Signed)
GERD (REFLUX)  is an extremely common cause of respiratory symptoms just like yours , many times with no obvious heartburn at all.    It can be treated with medication, but also with lifestyle changes including elevation of the head of your bed (ideally with 6 -8inch blocks under the headboard of your bed),  Smoking cessation, avoidance of late meals, excessive alcohol, and avoid fatty foods, chocolate, peppermint, colas, red wine, and acidic juices such as orange juice.  NO MINT OR MENTHOL PRODUCTS SO NO COUGH DROPS  USE SUGARLESS CANDY INSTEAD (Jolley ranchers or Stover's or Life Savers) or even ice chips will also do - the key is to swallow to prevent all throat clearing. NO OIL BASED VITAMINS - use powdered substitutes.  Avoid fish oil when coughing.   We will call to schedule your CT sinus and Chest and notify you of the results  Please remember to go to the lab department   for your tests - we will call you with the results when they are available.      Please schedule a follow up office visit in 6 weeks, call sooner if needed

## 2019-03-18 ENCOUNTER — Encounter: Payer: Self-pay | Admitting: Internal Medicine

## 2019-03-19 LAB — QUANTIFERON-TB GOLD PLUS
Mitogen-NIL: >10 IU/mL
NIL: 0.11 IU/mL
QuantiFERON-TB Gold Plus: NEGATIVE
TB1-NIL: 0.05 IU/mL
TB2-NIL: 0.04 IU/mL

## 2019-03-19 LAB — IGE: IgE (Immunoglobulin E), Serum: 24 kU/L (ref <=114)

## 2019-03-30 ENCOUNTER — Ambulatory Visit (INDEPENDENT_AMBULATORY_CARE_PROVIDER_SITE_OTHER)
Admission: RE | Admit: 2019-03-30 | Discharge: 2019-03-30 | Disposition: A | Discharge status: Home / Self Care | Payer: Managed Care, Other (non HMO) | Source: Ambulatory Visit | Attending: Internal Medicine | Admitting: Internal Medicine

## 2019-03-30 ENCOUNTER — Other Ambulatory Visit: Payer: Self-pay

## 2019-03-30 DIAGNOSIS — R059 Cough, unspecified: Secondary | ICD-10-CM

## 2019-03-30 DIAGNOSIS — R05 Cough: Secondary | ICD-10-CM

## 2019-04-06 NOTE — Progress Notes (Signed)
Pt aware of results of CT Max . Pts phone was having issues earlier this week. Nothing further needed.

## 2019-04-06 NOTE — Progress Notes (Signed)
Pt aware of CT findings/recommedation to f/u with gi prn. Copy faxed to pcp Nothing further needed.

## 2019-04-27 ENCOUNTER — Other Ambulatory Visit: Payer: Self-pay

## 2019-04-27 ENCOUNTER — Ambulatory Visit: Payer: Managed Care, Other (non HMO) | Admitting: Internal Medicine

## 2019-04-27 ENCOUNTER — Encounter: Payer: Self-pay | Admitting: Internal Medicine

## 2019-04-27 DIAGNOSIS — R0600 Dyspnea, unspecified: Secondary | ICD-10-CM | POA: Diagnosis not present

## 2019-04-27 DIAGNOSIS — R05 Cough: Secondary | ICD-10-CM | POA: Diagnosis not present

## 2019-04-27 DIAGNOSIS — R0609 Other forms of dyspnea: Secondary | ICD-10-CM

## 2019-04-27 DIAGNOSIS — R059 Cough, unspecified: Secondary | ICD-10-CM

## 2019-04-27 NOTE — Assessment & Plan Note (Signed)
Onset 2020 with steps at work with neg cards w/u 01/04/2019  - 04/27/2019   Walked RA  2 laps @  approx 252ft each @ rapid pace  stopped due to  End of study, min sob, no cp, sats 99%    >>>  Advised sub max ex up to 30 min daily and f/u in 6 weeks    I had an extended discussion with the patient reviewing all relevant studies completed to date and  lasting 15 to 20 minutes of a 25 minute visit  which included directly observing ambulatory 02 saturation study documented in a/p section of  today's  office note.  Each maintenance medication was reviewed in detail including most importantly the difference between maintenance and prns and under what circumstances the prns are to be triggered using an action plan format that is not reflected in the computer generated alphabetically organized AVS.     Please see AVS for specific instructions unique to this visit that I personally wrote and verbalized to the the pt in detail and then reviewed with pt  by my nurse highlighting any changes in therapy recommended at today's visit .

## 2019-04-27 NOTE — Progress Notes (Signed)
[name], female    DOB: 1955/06/30,    MRN: [id number]   Brief patient profile:  63  yowf quit smoking in 1995 with tendency to sinus infections from age [age] under care of [name] and then [name] with persistent sense of drainage and need to clear throat year round productive min mucoid sputum some  better with clariton D but disturbs sleep so rarely uses  with a new pattern of cough spring 2020 assoc with tickle beneath L breast resolves with cough minimal mucus so referred to pulmonary clinic 03/16/2019 by Dr   [name]  H/o ACEi cough   H/o ? Laryngospasm induced resp distress 02/02/19  with pulmonary infiltrates that cleared on f/u cxr 03/17/19  ? Asp ? Neg pressure pulmonary edema.   History of Present Illness  03/16/2019  Pulmonary/ 1st office eval/[name]  Chief Complaint  Patient presents with  . Consult - Pulmonary infiltrate per chest xray    Patient stated she had a dry cough for a while. Was tested for Covid which was negative. Back in August believes she breathed something in that caused her throat to closed in August. Radiologist told her there was an inflammation when chest xray was done.  Dyspnea:  Steps at work more difficult x sev months /assoc with  wt gain x 10 lb Cough:  Tickle sensation= chest dicofort, not really really painful or pleuritic,   happens multiple times a day and recurs after vigorous coughing appears to clear it. Sleep: 2 pillows / cough is worse in am but does not wake her up nor does the "chest tickle" SABA use:  ? Helps cough  - not really clear.  rec GERD  Diet  We will call to schedule your CT sinus and Chest and notify you of the results Please remember to go to the lab department   for your tests - we will call you with the results when they are available. Please schedule a follow up office visit in 6 weeks, call sooner if needed      04/27/2019  f/u ov/[name] re: uacs/ chronic doe  Chief Complaint  Patient presents with  .  Follow-up    Cough is some better since the last visit.    Dyspnea:  No change >  some doe x stairs and rest at top - assoc Post cp is midline only comes on with heavy exertion, resolves at rest  With nl coronaries 2011 when had the exact same problem. Fast walker across parking lot is the most she does now (< 5 min)  Cough: tickle in throat x years  Sleeping: flat 2 pillowsevery night  3-4 am wakes up and clear throat SABA use: none - had albuterol  Not using  02: none    No obvious day to day or daytime variability or assoc excess/ purulent sputum or mucus plugs or hemoptysis or cp or chest tightness, subjective wheeze or overt sinus or hb symptoms.     Also denies any obvious fluctuation of symptoms with weather or environmental changes or other aggravating or alleviating factors except as outlined above   No unusual exposure hx or h/o childhood pna/ asthma or knowledge of premature birth.  Current Allergies, Complete Past Medical History, Past Surgical History, Family History, and Social History were reviewed in [employer] record.  ROS  The following are not active complaints unless bolded Hoarseness, sore throat, dysphagia, dental problems, itching, sneezing,  nasal congestion or discharge of excess mucus  or purulent secretions, ear ache,   fever, chills, sweats, unintended wt loss or wt gain, classically pleuritic or exertional cp,  orthopnea pnd or arm/hand swelling  or leg swelling, presyncope, palpitations, abdominal pain, anorexia, nausea, vomiting, diarrhea  or change in bowel habits or change in bladder habits, change in stools or change in urine, dysuria, hematuria,  rash, arthralgias, visual complaints, headache, numbness, weakness or ataxia or problems with walking or coordination,  change in mood or  memory.        Current Meds  Medication Sig  . albuterol (VENTOLIN HFA) 108 (90 Base) MCG/ACT inhaler 1 PUFF AS NEEDED EVERY 6 HRS INHALATION 30 DAYS  .  aspirin 81 MG tablet Take 81 mg by mouth daily.   Marland Kitchen atorvastatin (LIPITOR) 10 MG tablet Take 10 mg by mouth daily.  . Cholecalciferol (VITAMIN D3) 50 MCG (2000 UT) CHEW Chew by mouth 2 (two) times a day.  . Dulaglutide (TRULICITY) A999333 0000000 SOPN Inject into the skin once a week.  Marland Kitchen LORazepam (ATIVAN) 0.5 MG tablet Take 0.5 mg by mouth as needed for anxiety.  Marland Kitchen losartan (COZAAR) 25 MG tablet Take 25 mg by mouth 2 (two) times a day.  . metFORMIN (GLUCOPHAGE) 850 MG tablet Take 850 mg by mouth 2 (two) times daily with a meal.  . metoprolol succinate (TOPROL-XL) 50 MG 24 hr tablet Take 50 mg by mouth daily. Take with or immediately following a meal.  . Omega-3 Fatty Acids (FISH OIL) 500 MG CAPS Take by mouth 2 (two) times a day.            Past Medical History:  Diagnosis Date  . Abnormal Pap smear of cervix    yrs ago  . Adenomyosis   . Chest pain   . DM (diabetes mellitus) ([place])   . Dyspareunia   . Endometriosis   . HLD (hyperlipidemia) 01/04/2019  . Hyperlipemia   . Hypertension   . Urinary incontinence         Objective:    amb mod obese wf nad  Wt Readings from Last 3 Encounters:  04/27/19 211 lb (95.7 kg)  03/16/19 212 lb 6.4 oz (96.3 kg)  01/04/19 207 lb 8 oz (94.1 kg)    BP 126/74 (BP Location: Left Arm, Cuff Size: Normal)   Pulse 66   Temp (!) 97.2 F (36.2 C) (Temporal)   Ht 5\' 5"  (1.651 m)   Wt 211 lb (95.7 kg)   LMP 05/21/2004   SpO2 97% Comment: on RA  BMI 35.11 kg/m      HEENT : pt wearing mask not removed for exam due to covid -19 concerns.    NECK :  without JVD/Nodes/TM/ nl carotid upstrokes bilaterally   LUNGS: no acc muscle use,  Nl contour chest which is clear to A and P bilaterally without cough on insp or exp maneuvers   CV:  RRR  no s3 or murmur or increase in P2, and no edema   ABD:  soft and nontender with nl inspiratory excursion in the supine position. No bruits or organomegaly appreciated, bowel sounds nl  MS:  Nl gait/ ext  warm without deformities, calf tenderness, cyanosis or clubbing No obvious joint restrictions   SKIN: warm and dry without lesions    NEURO:  alert, approp, nl sensorium with  no motor or cerebellar deficits apparent.         Assessment

## 2019-04-27 NOTE — Patient Instructions (Addendum)
For drainage / throat tickle try take CHLORPHENIRAMINE  4 mg  (Chlortab 4mg   at [employer] should be easiest to find in the green box)  take one every 4 hours as needed - available over the counter- may cause drowsiness so start with just a bedtime dose or two and see how you tolerate it before trying in daytime    If after two weeks you continue to have the same the night time cough or the daytime tickle then I recommend : Try prilosec otc 20mg   Take 30-60 min before first meal of the day and Pepcid ac (famotidine) 20 mg one @  bedtime until return here   GERD (REFLUX)  is an extremely common cause of respiratory symptoms just like yours , many times with no obvious heartburn at all.    It can be treated with medication, but also with lifestyle changes including elevation of the head of your bed (ideally with 6-8inch blocks under the headboard of your bed),  Smoking cessation, avoidance of late meals, excessive alcohol, and avoid fatty foods, chocolate, peppermint, colas, red wine, and acidic juices such as orange juice.  NO MINT OR MENTHOL PRODUCTS SO NO COUGH DROPS  USE SUGARLESS CANDY INSTEAD (Jolley ranchers or Stover's or Life Savers) or even ice chips will also do - the key is to swallow to prevent all throat clearing. NO OIL BASED VITAMINS - use powdered substitutes.  Avoid fish oil when coughing.   Please schedule a follow up office visit in 6 weeks, call sooner if needed with all medications /inhalers/ solutions in hand so we can verify exactly what you are taking. This includes all medications from all doctors and over the counters

## 2019-04-27 NOTE — Assessment & Plan Note (Signed)
Onset in 20's assoc with chronic sinus dz  - Quant GOLD TB  03/16/19  Neg  - allergy profile 03/16/19   IgE  24  Eos 0.2  - Sinus 10//02/2019 neg   - HRCT  03/30/2019  wnl x fatty steatosis of liver - 04/27/2019 added 1st gen H1 blockers per guidelines/ bed blocks   For noct symptoms and if not better p 2 weeks then add gerd rx   Upper airway cough syndrome (previously labeled PNDS),  is so named because it's frequently impossible to sort out how much is  CR/sinusitis with freq throat clearing (which can be related to primary GERD)   vs  causing  secondary (" extra esophageal")  GERD from wide swings in gastric pressure that occur with throat clearing, often  promoting self use of mint and menthol lozenges that reduce the lower esophageal sphincter tone and exacerbate the problem further in a cyclical fashion.   These are the same pts (now being labeled as having "irritable larynx syndrome" (which fits with the cough x decades)  by some cough centers) who not infrequently have a history of having failed to tolerate ace inhibitors,  dry powder inhalers or biphosphonates or report having atypical/extraesophageal reflux symptoms (like chest pain/ throat irritation)  that don't respond to standard doses of PPI  and are easily confused as having aecopd or asthma flares by even experienced allergists/ pulmonologists (myself included).    rec 1st gen H1 blockers per guidelines  Then gerd rx   F/u in 6 weeks and if not better next step is gabapentin

## 2019-06-08 ENCOUNTER — Ambulatory Visit: Payer: Managed Care, Other (non HMO) | Admitting: Internal Medicine

## 2019-07-10 ENCOUNTER — Ambulatory Visit: Payer: Managed Care, Other (non HMO) | Admitting: Internal Medicine

## 2019-11-13 ENCOUNTER — Encounter: Payer: Self-pay | Admitting: Internal Medicine

## 2020-01-11 ENCOUNTER — Other Ambulatory Visit: Payer: Self-pay

## 2020-01-11 ENCOUNTER — Ambulatory Visit (AMBULATORY_SURGERY_CENTER): Payer: Self-pay | Admitting: *Deleted

## 2020-01-11 ENCOUNTER — Encounter: Payer: Self-pay | Admitting: Internal Medicine

## 2020-01-11 VITALS — Ht 65.0 in | Wt 189.0 lb

## 2020-01-11 DIAGNOSIS — Z1211 Encounter for screening for malignant neoplasm of colon: Secondary | ICD-10-CM

## 2020-01-11 NOTE — Progress Notes (Signed)
No egg or soy allergy known to patient  No issues with past sedation with any surgeries or procedures no intubation problems in the past  No diet pills per patient No home 02 use per patient  No blood thinners per patient  Pt denies issues with constipation  No A fib or A flutter  EMMI video to pt or MyChart  COVID 19 guidelines implemented in [facility] today   Pt has had Covid vacc x 2    Due to the COVID-19 pandemic we are asking patients to follow these guidelines. Please only bring one care partner. Please be aware that your care partner may wait in the car in the parking lot or if they feel like they will be too hot to wait in the car, they may wait in the lobby on the 4th floor. All care partners are required to wear a mask the entire time (we do not have any that we can provide them), they need to practice social distancing, and we will do a Covid check for all patient's and care partners when you arrive. Also we will check their temperature and your temperature. If the care partner waits in their car they need to stay in the parking lot the entire time and we will call them on their cell phone when the patient is ready for discharge so they can bring the car to the front of the building. Also all patient's will need to wear a mask into building.

## 2020-01-25 ENCOUNTER — Ambulatory Visit (AMBULATORY_SURGERY_CENTER): Payer: 59 | Admitting: Internal Medicine

## 2020-01-25 ENCOUNTER — Encounter: Payer: Self-pay | Admitting: Internal Medicine

## 2020-01-25 ENCOUNTER — Other Ambulatory Visit: Payer: Self-pay

## 2020-01-25 VITALS — BP 143/62 | HR 70 | Temp 97.8°F | Resp 12 | Ht 65.0 in | Wt 189.0 lb

## 2020-01-25 DIAGNOSIS — Z1211 Encounter for screening for malignant neoplasm of colon: Secondary | ICD-10-CM

## 2020-01-25 MED ORDER — SODIUM CHLORIDE 0.9 % IV SOLN: 500.0000 mL | Freq: Once | INTRAVENOUS | Status: DC

## 2020-01-25 NOTE — Patient Instructions (Addendum)
No polyps or cancer seen.  You do have diverticulosis - thickened muscle rings and pouches in the colon wall. Please read the handout about this condition.  Next routine colonoscopy or other screening test in 10 years - 2031  I appreciate the opportunity to care for you. Gatha Mayer, MD, [facility]  Handout given:  Diverticulosis Repeat colonoscopy in 10 years Resume previous diet  continue present medciations  YOU HAD AN ENDOSCOPIC PROCEDURE TODAY AT [facility]:   Refer to the procedure report that was given to you for any specific questions about what was found during the examination.  If the procedure report does not answer your questions, please call your gastroenterologist to clarify.  If you requested that your care partner not be given the details of your procedure findings, then the procedure report has been included in a sealed envelope for you to review at your convenience later.  YOU SHOULD EXPECT: Some feelings of bloating in the abdomen. Passage of more gas than usual.  Walking can help get rid of the air that was put into your GI tract during the procedure and reduce the bloating. If you had a lower endoscopy (such as a colonoscopy or flexible sigmoidoscopy) you may notice spotting of blood in your stool or on the toilet paper. If you underwent a bowel prep for your procedure, you may not have a normal bowel movement for a few days.  Please Note:  You might notice some irritation and congestion in your nose or some drainage.  This is from the oxygen used during your procedure.  There is no need for concern and it should clear up in a day or so.  SYMPTOMS TO REPORT IMMEDIATELY:   Following lower endoscopy (colonoscopy or flexible sigmoidoscopy):  Excessive amounts of blood in the stool  Significant tenderness or worsening of abdominal pains  Swelling of the abdomen that is new, acute  Fever of 100F or higher   For urgent or emergent issues, a  gastroenterologist can be reached at any hour by calling [phone]. Do not use MyChart messaging for urgent concerns.    DIET:  We do recommend a small meal at first, but then you may proceed to your regular diet.  Drink plenty of fluids but you should avoid alcoholic beverages for 24 hours.  ACTIVITY:  You should plan to take it easy for the rest of today and you should NOT DRIVE or use heavy machinery until tomorrow (because of the sedation medicines used during the test).    FOLLOW UP: Our staff will call the number listed on your records 48-72 hours following your procedure to check on you and address any questions or concerns that you may have regarding the information given to you following your procedure. If we do not reach you, we will leave a message.  We will attempt to reach you two times.  During this call, we will ask if you have developed any symptoms of COVID 19. If you develop any symptoms (ie: fever, flu-like symptoms, shortness of breath, cough etc.) before then, please call [phone].  If you test positive for Covid 19 in the 2 weeks post procedure, please call and report this information to [place].    If any biopsies were taken you will be contacted by phone or by letter within the next 1-3 weeks.  Please call us at [phone] if you have not heard about the biopsies in 3 weeks.    SIGNATURES/CONFIDENTIALITY: You and/or  your care partner have signed paperwork which will be entered into your electronic medical record.  These signatures attest to the fact that that the information above on your After Visit Summary has been reviewed and is understood.  Full responsibility of the confidentiality of this discharge information lies with you and/or your care-partner.

## 2020-01-25 NOTE — Op Note (Signed)
[facility] Patient Name: [name] Procedure Date: 01/25/2020 10:26 AM MRN: [id number] Endoscopist: Gatha Mayer , MD Age: 64 Referring MD:  Date of Birth: [birth year] Gender: Female Account #: [id number] Procedure:                Colonoscopy Indications:              Screening for colorectal malignant neoplasm Medicines:                Propofol per Anesthesia, Monitored Anesthesia Care Procedure:                Pre-Anesthesia Assessment:                           - Prior to the procedure, a History and Physical                            was performed, and patient medications and                            allergies were reviewed. The patient's tolerance of                            previous anesthesia was also reviewed. The risks                            and benefits of the procedure and the sedation                            options and risks were discussed with the patient.                            All questions were answered, and informed consent                            was obtained. Prior Anticoagulants: The patient has                            taken no previous anticoagulant or antiplatelet                            agents. ASA Grade Assessment: II - A patient with                            mild systemic disease. After reviewing the risks                            and benefits, the patient was deemed in                            satisfactory condition to undergo the procedure.                           After obtaining informed consent, the colonoscope  was passed under direct vision. Throughout the                            procedure, the patient's blood pressure, pulse, and                            oxygen saturations were monitored continuously. The                            Colonoscope was introduced through the anus and                            advanced to the the cecum, identified by                             appendiceal orifice and ileocecal valve. The                            ileocecal valve, appendiceal orifice, and rectum                            were photographed. The quality of the bowel                            preparation was excellent. The bowel preparation                            used was Miralax via split dose instruction. Scope In: 10:38:46 AM Scope Out: 10:59:46 AM Scope Withdrawal Time: 0 hours 13 minutes 53 seconds  Total Procedure Duration: 0 hours 21 minutes 0 seconds  Findings:                 The perianal and digital rectal examinations were                            normal.                           Multiple small and large-mouthed diverticula were                            found in the sigmoid colon. There was narrowing of                            the colon in association with the diverticular                            opening.                           The exam was otherwise without abnormality on                            direct and retroflexion views. Complications:            No immediate complications. Estimated  blood loss:                            None. Estimated Blood Loss:     Estimated blood loss: none. Impression:               - Severe diverticulosis in the sigmoid colon. There                            was narrowing of the colon in association with the                            diverticular opening.                           - The examination was otherwise normal on direct                            and retroflexion views.                           - No specimens collected. Recommendation:           - Repeat colonoscopy or other appropriate test in                            10 years for screening purposes.                           - Resume previous diet.                           - Continue present medications. Gatha Mayer, MD 01/25/2020 11:07:04 AM This report has been signed electronically.

## 2020-01-25 NOTE — Progress Notes (Signed)
pt tolerated well. VSS. awake and to recovery. Report given to RN.  

## 2020-01-25 NOTE — Progress Notes (Signed)
Patients history reviewed. Vaccinated 09/22/19 VS-C.W

## 2020-01-29 ENCOUNTER — Telehealth: Payer: Self-pay | Admitting: *Deleted

## 2020-01-29 ENCOUNTER — Telehealth: Payer: Self-pay

## 2020-01-29 NOTE — Telephone Encounter (Signed)
Left message on answering machine. 

## 2020-01-29 NOTE — Telephone Encounter (Signed)
  Follow up Call-  Call back number 01/25/2020  Post procedure Call Back phone  # [phone]  Permission to leave phone message Yes  Some recent data might be hidden     Patient questions:  Do you have a fever, pain , or abdominal swelling? No. Pain Score  0 *  Have you tolerated food without any problems? Yes.    Have you been able to return to your normal activities? Yes.    Do you have any questions about your discharge instructions: Diet   No. Medications  No. Follow up visit  No.  Do you have questions or concerns about your Care? No.  Actions: * If pain score is 4 or above: No action needed, pain <4.  1. Have you developed a fever since your procedure? no  2.   Have you had an respiratory symptoms (SOB or cough) since your procedure? no  3.   Have you tested positive for COVID 19 since your procedure no  4.   Have you had any family members/close contacts diagnosed with the COVID 19 since your procedure?  No  The patient was very complimentary of all the staff from start to finish. States everyone was so nice and it made the experience so much better and relaxing for her.    If yes to any of these questions please route to Joylene John, RN and Joella Prince, RN

## 2020-04-11 ENCOUNTER — Other Ambulatory Visit: Payer: Self-pay

## 2020-04-11 ENCOUNTER — Ambulatory Visit
Admission: RE | Admit: 2020-04-11 | Discharge: 2020-04-11 | Disposition: A | Discharge status: Home / Self Care | Payer: 59 | Source: Ambulatory Visit | Attending: Internal Medicine | Admitting: Internal Medicine

## 2020-04-11 ENCOUNTER — Other Ambulatory Visit: Payer: Self-pay | Admitting: Internal Medicine

## 2020-04-11 DIAGNOSIS — N200 Calculus of kidney: Secondary | ICD-10-CM

## 2020-07-08 ENCOUNTER — Ambulatory Visit: Payer: 59 | Admitting: Sports Medicine

## 2020-07-08 ENCOUNTER — Encounter: Payer: Self-pay | Admitting: Sports Medicine

## 2020-07-08 ENCOUNTER — Other Ambulatory Visit: Payer: Self-pay

## 2020-07-08 VITALS — BP 132/82 | Ht 65.5 in | Wt 178.0 lb

## 2020-07-08 DIAGNOSIS — G5622 Lesion of ulnar nerve, left upper limb: Secondary | ICD-10-CM | POA: Diagnosis not present

## 2020-07-08 MED ORDER — MELOXICAM 15 MG PO TABS: ORAL_TABLET | ORAL | 0 refills | Status: DC

## 2020-07-08 NOTE — Progress Notes (Signed)
   PCP: [name], MD  Subjective:   HPI: [name] is a 65 y.o. female here for evaluation of left arm numbness and tingling.  She reports that a few weeks ago, she was having some left hip pain, so she borrowed a cane and used it for 1 day.  The day after that, she started having numbness and tingling mostly on the ulnar aspect of her forearm.  She also had some associated grip strength weakness.  She stopped using the cane, and presented to her PCP who felt this likely to be neuropathic and referred here for further evaluation.  She also got a massage, in which they mostly focused on her periscapular muscles and felt that this was helpful.  She has not been using any medications and seems like it is improving over time.  Today, patient states that she has had resolution of any weakness, but does continue to have some numbness and tingling on the ulnar aspect of her forearm.  Review of Systems:  Per HPI.   [place], medications and smoking status reviewed.      Objective:  Physical Exam:  No flowsheet data found.   Gen: awake, alert, NAD, comfortable in exam room Pulm: breathing unlabored  Left Elbow Inspection: No erythema, ecchymosis, swelling edema.  Active ROM: Full range of motion to flexion, extension, pronation, supination.  Passive ROM: Full range of motion to flexion, extension, pronation, supination.  Strength: 5/5 strength to resisted flexion, extension of the elbow.  5/5 grip strength, 5 out of 5 strength in all planes of the wrist. Sensation: Decreased sensation over the ulnar aspect of the forearm extending from approximately 5 cm distal to the medial epicondyle to 5 cm proximal to the wrist. Tenderness: No bony tenderness to olecranon, medial epicondyle, lateral epicondyle, radial head, extensor bundle insertion, flexor bundle insertion  Special tests: Stable to varus/valgus stress. No pain with resisted wrist extension. No pain with resisted wrist flexion. No  interosseous tenderness or pain with maneuvers    Assessment & Plan:  1.  Ulnar nerve entrapment  Patient with signs symptoms of neuropathy in the ulnar nerve distribution of the forearm.  Differential includes cubital tunnel syndrome, more distal ulnar nerve entrapment, cervical radiculopathy.  Given that the pain is only distal to the elbow, suspect ulnar nerve entrapment more than cervical radiculopathy.  Given the timing after using a cane, is possible that she was using her forearm muscles to grip the cane, aggravating the ulnar nerve as it exits the cubital tunnel or as it traverses between the 2 heads of the FCU musculature.  Regardless, patient is having significant provement symptoms as no longer having weakness.  Plan: -Recommend monitoring, if symptoms continue to resolve would not intervene -Patient was given meloxicam to use if she does not continue to have improvement -If she has worsening, or starts having weakness, advised to make another appointment.  At that time would consider further evaluation with nerve conduction studies.  Dagoberto Ligas, MD [facility] 07/08/2020 3:25 PM   Patient seen and evaluated with the sports medicine fellow.  I agree with the above plan of care.  Its not quite clear where her nerve entrapment is, but her symptoms are improving.  She has no weakness on today's exam.  Treatment as above and follow-up for ongoing or recalcitrant issues

## 2020-07-08 NOTE — Patient Instructions (Signed)
Thank you for coming in to see [place] today!  You likely have a pinched nerve in your forearm, causing numbness and tingling.  Since your symptoms are improving, lets plan to monitor this for now.  Please see below to review our plan for today's visit:    1.   You written a prescription for meloxicam, please take it if you do not have continued improvement over the next week, starting with 1/day for 1 week and then as needed after that.  Please take with food. 2.   If you do develop weakness or worsening symptoms, please call to schedule follow-up visit.  At that time we would likely want to get imaging.   Please call the clinic at [phone] if your symptoms worsen or you have any concerns. It was our pleasure to serve you.       Dr. Dagoberto Ligas [facility]

## 2020-11-04 ENCOUNTER — Encounter (HOSPITAL_COMMUNITY): Payer: Self-pay

## 2020-11-04 ENCOUNTER — Emergency Department (HOSPITAL_COMMUNITY)
Admission: EM | Admit: 2020-11-04 | Discharge: 2020-11-04 | Disposition: A | Discharge status: Home / Self Care | Payer: 59 | Attending: Emergency Medicine | Admitting: Emergency Medicine

## 2020-11-04 ENCOUNTER — Other Ambulatory Visit: Payer: Self-pay

## 2020-11-04 ENCOUNTER — Emergency Department (HOSPITAL_COMMUNITY): Payer: 59

## 2020-11-04 DIAGNOSIS — R079 Chest pain, unspecified: Secondary | ICD-10-CM | POA: Diagnosis present

## 2020-11-04 DIAGNOSIS — I10 Essential (primary) hypertension: Secondary | ICD-10-CM | POA: Insufficient documentation

## 2020-11-04 DIAGNOSIS — Z7982 Long term (current) use of aspirin: Secondary | ICD-10-CM | POA: Diagnosis not present

## 2020-11-04 DIAGNOSIS — Z794 Long term (current) use of insulin: Secondary | ICD-10-CM | POA: Insufficient documentation

## 2020-11-04 DIAGNOSIS — Z7984 Long term (current) use of oral hypoglycemic drugs: Secondary | ICD-10-CM | POA: Diagnosis not present

## 2020-11-04 DIAGNOSIS — Z79899 Other long term (current) drug therapy: Secondary | ICD-10-CM | POA: Diagnosis not present

## 2020-11-04 DIAGNOSIS — Z87891 Personal history of nicotine dependence: Secondary | ICD-10-CM | POA: Diagnosis not present

## 2020-11-04 DIAGNOSIS — Z85828 Personal history of other malignant neoplasm of skin: Secondary | ICD-10-CM | POA: Insufficient documentation

## 2020-11-04 DIAGNOSIS — I160 Hypertensive urgency: Secondary | ICD-10-CM

## 2020-11-04 DIAGNOSIS — E119 Type 2 diabetes mellitus without complications: Secondary | ICD-10-CM | POA: Diagnosis not present

## 2020-11-04 LAB — BASIC METABOLIC PANEL
Anion gap: 8 (ref 5–15)
BUN: 19 mg/dL (ref 8–23)
CO2: 25 mmol/L (ref 22–32)
Calcium: 9.4 mg/dL (ref 8.9–10.3)
Chloride: 107 mmol/L (ref 98–111)
Creatinine, Ser: 0.72 mg/dL (ref 0.44–1.00)
GFR, Estimated: >60 mL/min (ref >60)
Glucose, Bld: 128 mg/dL — ABNORMAL HIGH (ref 70–99)
Potassium: 3.6 mmol/L (ref 3.5–5.1)
Sodium: 140 mmol/L (ref 135–145)

## 2020-11-04 LAB — CBC
HCT: 41.3 % (ref 36.0–46.0)
Hemoglobin: 13.6 g/dL (ref 12.0–15.0)
MCH: 29 pg (ref 26.0–34.0)
MCHC: 32.9 g/dL (ref 30.0–36.0)
MCV: 88.1 fL (ref 80.0–100.0)
Platelets: 226 10*3/uL (ref 150–400)
RBC: 4.69 MIL/uL (ref 3.87–5.11)
RDW: 13.1 % (ref 11.5–15.5)
WBC: 6 10*3/uL (ref 4.0–10.5)
nRBC: 0 % (ref 0.0–0.2)

## 2020-11-04 LAB — TROPONIN I (HIGH SENSITIVITY)
Troponin I (High Sensitivity): 4 ng/L (ref <18)
Troponin I (High Sensitivity): 4 ng/L (ref <18)

## 2020-11-04 NOTE — ED Notes (Signed)
Pt remains alert and oriented x 4. Troponin x 2 negative. Cleared for discharge. Ambulatory on a steady gait.

## 2020-11-04 NOTE — Discharge Instructions (Signed)
If you develop recurrent, continued, or worsening chest pain, shortness of breath, fever, vomiting, abdominal or back pain, or any other new/concerning symptoms then return to the ER for evaluation.  

## 2020-11-04 NOTE — ED Triage Notes (Signed)
Pt reports Left side chest discomfort x2-3 weeks, increased today. No other associated symptoms

## 2020-11-04 NOTE — ED Provider Notes (Signed)
[place] EMERGENCY DEPARTMENT Provider Note   CSN: [id number] Arrival date & time: 11/04/20  1016     History Chief Complaint  Patient presents with  . Chest Pain    [name] is a 65 y.o. female.  HPI 65 year old female presents with chest pain.  It has been on and off for several months but much more frequent over the last week.  Is gotten a little stronger and so it has scared her.  The pain is in the middle/left side of her chest and feels like a spasm/very brief pain.  It lasts maybe 5 seconds.  No other symptoms such as pain going to her back or other radiation.  There is no shortness of breath.  No fevers or cough or leg swelling.  Nothing she does makes it better or worse such as positions, breathing, exertion.  Past Medical History:  Diagnosis Date  . Abnormal Pap smear of cervix    yrs ago  . Adenomyosis   . Adenomyosis   . Allergy   . Arthritis   . Cancer ([place])    skin cancer   . Chest pain   . Diverticulosis   . DM (diabetes mellitus) ([place])   . Dyspareunia   . GERD (gastroesophageal reflux disease)   . HLD (hyperlipidemia) 01/04/2019  . Hyperlipemia   . Hypertension   . Neuromuscular disorder ([facility])    slight neuropathy feet   . PONV (postoperative nausea and vomiting)   . Urinary incontinence     Patient Active Problem List   Diagnosis Date Noted  . Cough 03/16/2019  . Essential hypertension 01/05/2019  . HLD (hyperlipidemia) 01/04/2019  . Atypical chest pain 11/17/2018  . Exertional dyspnea 11/17/2018    Past Surgical History:  Procedure Laterality Date  . CARDIAC CATHETERIZATION  02/16/10   normal coronaries, normal LV function,EF >55%  . COLONOSCOPY     age 81- normal per pt   . NOSE SURGERY     deviated septum   . TUBAL LIGATION  1996  . TUMOR REMOVAL  1979   [place]  . VAGINAL HYSTERECTOMY  12/05     OB History    Gravida  3   Para  2   Term  2   Preterm      AB  1   Living  2     SAB       IAB      Ectopic      Multiple      Live Births  2           Family History  Problem Relation Age of Onset  . Heart attack Father   . Hyperlipidemia Father   . Hypertension Sister   . Heart disease Sister        bypass  . Diverticulitis Sister   . Cancer Maternal Grandmother        cervical & stomach  . Stomach cancer Maternal Grandmother   . Cervical cancer Maternal Grandmother   . Hyperlipidemia Sister   . Diabetes Sister   . Colon polyps Sister   . Cervical cancer Maternal Aunt   . Colon cancer Neg Hx   . Esophageal cancer Neg Hx   . Rectal cancer Neg Hx     Social History   Tobacco Use  . Smoking status: Former Smoker    Packs/day: 0.25    Years: 20.00    Pack years: 5.00    Types: Cigarettes  Quit date: 58    Years since quitting: 27.3  . Smokeless tobacco: Never Used  Vaping Use  . Vaping Use: Never used  Substance Use Topics  . Alcohol use: Yes    Alcohol/week: 1.0 standard drink    Types: 1 Glasses of wine per week    Comment:  social only  . Drug use: No    Home Medications Prior to Admission medications   Medication Sig Start Date End Date Taking? Authorizing Provider  albuterol (VENTOLIN HFA) 108 (90 Base) MCG/ACT inhaler 1 PUFF AS NEEDED EVERY 6 HRS INHALATION 30 DAYS Patient not taking: Reported on 01/25/2020 02/05/19   [provider]  aspirin 81 MG tablet Take 81 mg by mouth daily.     [provider]  atorvastatin (LIPITOR) 10 MG tablet Take 10 mg by mouth daily.    [provider]  Cholecalciferol (VITAMIN D3) 50 MCG (2000 UT) CHEW Chew by mouth 2 (two) times a day.    [provider]  Dulaglutide (TRULICITY) 6.60 YT/0.1SW SOPN Inject into the skin once a week. Sunday    [provider]  LORazepam (ATIVAN) 0.5 MG tablet Take 0.5 mg by mouth as needed for anxiety.  Patient not taking: Reported on 01/25/2020    [provider]  losartan (COZAAR) 25 MG tablet Take 50 mg by mouth  daily.     [provider]  meloxicam (MOBIC) 15 MG tablet Take 1 tablet daily with food for 7 days. Then take as needed. 07/08/20   Thurman Coyer, DO  metFORMIN (GLUCOPHAGE) 850 MG tablet Take 850 mg by mouth 2 (two) times daily with a meal.    [provider]  metFORMIN (GLUCOPHAGE-XR) 750 MG 24 hr tablet Take 750 mg by mouth daily with breakfast.  12/27/19   [provider]  metoprolol succinate (TOPROL-XL) 50 MG 24 hr tablet Take 50 mg by mouth daily. Take with or immediately following a meal.    [provider]    Allergies    Amoxicillin  Review of Systems   Review of Systems  Respiratory: Negative for shortness of breath.   Cardiovascular: Positive for chest pain.  Gastrointestinal: Negative for abdominal pain.  All other systems reviewed and are negative.   Physical Exam Updated Vital Signs BP (!) 193/86 (BP Location: Left Arm)   Pulse 73   Temp 97.7 F (36.5 C) (Oral)   Resp 17   LMP 05/21/2004   SpO2 99%   Physical Exam Vitals and nursing note reviewed.  Constitutional:      General: She is not in acute distress.    Appearance: She is well-developed. She is not ill-appearing or diaphoretic.  HENT:     Head: Normocephalic and atraumatic.     Right Ear: External ear normal.     Left Ear: External ear normal.     Nose: Nose normal.  Eyes:     General:        Right eye: No discharge.        Left eye: No discharge.  Cardiovascular:     Rate and Rhythm: Normal rate and regular rhythm.     Heart sounds: Normal heart sounds.  Pulmonary:     Effort: Pulmonary effort is normal.     Breath sounds: Normal breath sounds.  Abdominal:     Palpations: Abdomen is soft.     Tenderness: There is no abdominal tenderness.  Musculoskeletal:     Right lower leg: No edema.  Left lower leg: No edema.  Skin:    General: Skin is warm and dry.  Neurological:     Mental Status: She is alert.  Psychiatric:        Mood and Affect: Mood is  not anxious.     ED Results / Procedures / Treatments   Labs (all labs ordered are listed, but only abnormal results are displayed) Labs Reviewed  BASIC METABOLIC PANEL - Abnormal; Notable for the following components:      Result Value   Glucose, Bld 128 (*)    All other components within normal limits  CBC  TROPONIN I (HIGH SENSITIVITY)  TROPONIN I (HIGH SENSITIVITY)    EKG EKG Interpretation  Date/Time:  Tuesday Nov 04 2020 10:28:05 EDT Ventricular Rate:  79 PR Interval:  152 QRS Duration: 92 QT Interval:  390 QTC Calculation: 447 R Axis:   72 Text Interpretation: Normal sinus rhythm Nonspecific T wave abnormality Abnormal ECG nonspecific T waves in similar distribution to Aug 2011 Confirmed by Sherwood Gambler [phone]) on 11/04/2020 1:55:19 PM   Radiology DG Chest 2 View  Result Date: 11/04/2020 CLINICAL DATA:  Chest pain EXAM: CHEST - 2 VIEW COMPARISON:  02/14/2010 FINDINGS: The heart size and mediastinal contours are within normal limits. Both lungs are clear. The visualized skeletal structures are unremarkable. IMPRESSION: No active cardiopulmonary disease. Electronically Signed   By: Rolm Baptise M.D.   On: 11/04/2020 11:04    Procedures Procedures   Medications Ordered in ED Medications - No data to display  ED Course  I have reviewed the triage vital signs and the nursing notes.  Pertinent labs & imaging results that were available during my care of the patient were reviewed by me and considered in my medical decision making (see chart for details).    MDM Rules/Calculators/A&P                          Patient is well-appearing in the emergency department.  Her ECG shows some nonspecific T waves but these are stable from a decade ago.  She is pretty hypertensive here but my suspicion that this is ACS or hypertensive emergency is quite low.  No severe pain that would go to her back to make me suspect aortic dissection and I have low suspicion for PE.  She is  due for a follow-up appoint with her PCP tomorrow and they can address the blood pressure there as she has been compliant with her meds.  As for her heart, troponins are negative x2 and I do not think she needs an emergent evaluation in the hospital and we discussed following up with a cardiologist he saw a couple years ago.  We also discussed return precautions and she appears stable for discharge home. Final Clinical Impression(s) / ED Diagnoses Final diagnoses:  None    Rx / DC Orders ED Discharge Orders    None       Sherwood Gambler, MD 11/04/20 [phone]

## 2020-11-10 NOTE — Progress Notes (Signed)
Patient referred by [name], MD for chest tightness  Subjective:   [name], female    DOB: [birth year], 65 y.o.   MRN: [id number]   Chief Complaint  Patient presents with  . Chest Pain  . Follow-up     HPI  65 y.o. Caucasian female with hypertension, hyperlipidemia, [name], mild obesity, with chest pain.  Patient was last seen by me in 2020. She underwent echocardiogram and Lexiscan stress test, both were low risk. Patient now returns due to increased episodes of retrosternal chest pain. Episodes last for a few seconds, get better with coughing. They are not necessarily worse with exertion.  Blood pressure is elevated today. She has recently resumed losartan 50 mg, with additional dose of 25 mg as needed, for management of hypertension.    Current Outpatient Medications on File Prior to Visit  Medication Sig Dispense Refill  . albuterol (VENTOLIN HFA) 108 (90 Base) MCG/ACT inhaler 1 PUFF AS NEEDED EVERY 6 HRS INHALATION 30 DAYS    . aspirin 81 MG tablet Take 81 mg by mouth daily.     Marland Kitchen atorvastatin (LIPITOR) 10 MG tablet Take 10 mg by mouth daily.    . Cholecalciferol (VITAMIN D3) 50 MCG (2000 UT) CHEW Chew by mouth 2 (two) times a day.    . Dulaglutide 0.75 MG/0.5ML SOPN Inject into the skin once a week. Sunday    . LORazepam (ATIVAN) 0.5 MG tablet Take 0.5 mg by mouth as needed for anxiety.    Marland Kitchen losartan (COZAAR) 25 MG tablet Take 50 mg by mouth daily.     . metFORMIN (GLUCOPHAGE) 850 MG tablet Take 850 mg by mouth as needed.    . metoprolol succinate (TOPROL-XL) 50 MG 24 hr tablet Take 50 mg by mouth daily. Take with or immediately following a meal.    . Omega-3 Fatty Acids (FISH OIL) 500 MG CAPS Take 500 mg by mouth daily.     No current facility-administered medications on file prior to visit.    Cardiovascular studies:  EKG 11/11/2020: Sinus rhythm 62 bpm  RSR(V1) -nondiagnostic Nonspecific T-abnormality  Echocardiogram  01/04/2019: Normal LV systolic function with EF 65%. Left ventricle cavity is normal in size. Mild concentric hypertrophy of the left ventricle. Normal global wall motion. Doppler evidence of grade I (impaired) diastolic dysfunction, normal LAP.  Left atrial cavity is mildly dilated. Mild (Grade I) mitral regurgitation. Normal right atrial pressure.   Lexiscan Myoview Stress Test 12/18/2018: Stress EKG is non-diagnostic, as this is pharmacological stress test. RAW images reveal mild breast attenuation artifact in the apical antieor wall. Normal myocardial perfusion. NLVEF 64%. Low risk study.   Lexiscan Myoview Stress Test 12/18/2018: Stress EKG is non-diagnostic, as this is pharmacological stress test. RAW images reveal mild breast attenuation artifact in the apical antieor wall. Normal myocardial perfusion. NLVEF 64%. Low risk study.  EKG 11/17/2018: Sinus rhythm 71 bpm.  Normal EKG.   Recent labs: 10/2020: Glucose 99, BUN/Cr 17/0.77. EGFR 81. Na/K 146/4. Rest of the CMP normal H/H 38.7/12.8. MCV 86.8. Platelets 207 HbA1C 6.0 % Chol 170, TG 49, HDL 65, LDL 95 TSH 2.7 normal  10/19/2018: Glucose 103. BUN/Cr 18/0.7. eGFR 86. Na/K ?/4.Marland Kitchen H/H 13/39. MCV 86. Platelets 266. Chol 15, TG 72, HDL 35, LDL 88   Review of Systems  Cardiovascular: Positive for chest pain. Negative for dyspnea on exertion, leg swelling, palpitations and syncope.           Vitals:   11/11/20  0932 11/11/20 0937  BP: (!) 181/77 (!) 171/96  Pulse: 67 68  Temp: 98.4 F (36.9 C)   SpO2: 96%      Body mass index is 31.3 kg/m. Filed Weights   11/11/20 0932  Weight: 191 lb (86.6 kg)     Objective:  Physical Exam Vitals and nursing note reviewed.  Constitutional:      General: She is not in acute distress. Neck:     Vascular: No JVD.  Cardiovascular:     Rate and Rhythm: Normal rate and regular rhythm.     Pulses: Normal pulses.     Heart sounds: Normal heart sounds. No murmur  heard.   Pulmonary:     Effort: Pulmonary effort is normal.     Breath sounds: Normal breath sounds. No wheezing or rales.  Chest:     Chest wall: Tenderness present.          Assessment & Recommendations:   65 y.o. Caucasian female with hypertension, hyperlipidemia, [name], mild obesity, with chest pain.  Atypical chest pain: Increased frequency recently. Previously had Lexiscan nuclear stress test 2 years ago. I will go ahead an repeat a nuclear stress test, if possible with exercise. I have asked ger to hold metoprolol the night before the stress test and take a total of 100 mg losartan for that day alone.   Hypertension: Suboptimal control. Recommend home monitoring. Eventually, she may need further uptitration of her antihypertensive therapy.   F/u after stress test  Manish J Patwardhan, MD [facility] Pager: [phone] Office: [phone] If no answer Cell [phone]   

## 2020-11-11 ENCOUNTER — Ambulatory Visit: Payer: 59 | Admitting: Cardiology

## 2020-11-11 ENCOUNTER — Other Ambulatory Visit: Payer: Self-pay

## 2020-11-11 ENCOUNTER — Encounter: Payer: Self-pay | Admitting: Cardiology

## 2020-11-11 VITALS — BP 171/96 | HR 68 | Temp 98.4°F | Ht 65.5 in | Wt 191.0 lb

## 2020-11-11 DIAGNOSIS — R0600 Dyspnea, unspecified: Secondary | ICD-10-CM

## 2020-11-11 DIAGNOSIS — I10 Essential (primary) hypertension: Secondary | ICD-10-CM

## 2020-11-11 DIAGNOSIS — R072 Precordial pain: Secondary | ICD-10-CM

## 2020-11-11 DIAGNOSIS — R0609 Other forms of dyspnea: Secondary | ICD-10-CM

## 2020-11-24 ENCOUNTER — Ambulatory Visit: Payer: 59

## 2020-11-24 ENCOUNTER — Other Ambulatory Visit: Payer: Self-pay

## 2020-11-24 DIAGNOSIS — R072 Precordial pain: Secondary | ICD-10-CM

## 2020-12-10 ENCOUNTER — Ambulatory Visit: Payer: 59 | Admitting: Cardiology

## 2020-12-14 DIAGNOSIS — R072 Precordial pain: Secondary | ICD-10-CM | POA: Insufficient documentation

## 2020-12-14 NOTE — Progress Notes (Signed)
Patient referred by Georgianne Fick, MD for chest tightness  Subjective:   [name], female    DOB: 04/05/1956, 65 y.o.   MRN: [id number]   Chief Complaint  Patient presents with   Chest Pain   Follow-up   Results    Stress test    HPI  65 y.o. Caucasian female with hypertension, hyperlipidemia, [name], mild obesity, with chest pain.  Discussed stress test results with the patient, details below. Her symptoms of chest pain have improved as the blood pressure has improved. Blood pressure still remains elevated.    Current Outpatient Medications on File Prior to Visit  Medication Sig Dispense Refill   albuterol (VENTOLIN HFA) 108 (90 Base) MCG/ACT inhaler 1 PUFF AS NEEDED EVERY 6 HRS INHALATION 30 DAYS     aspirin 81 MG tablet Take 81 mg by mouth daily.      atorvastatin (LIPITOR) 10 MG tablet Take 10 mg by mouth daily.     Cholecalciferol (VITAMIN D3) 50 MCG (2000 UT) CHEW Chew by mouth 2 (two) times a day.     Dulaglutide 0.75 MG/0.5ML SOPN Inject into the skin once a week. Sunday     LORazepam (ATIVAN) 0.5 MG tablet Take 0.5 mg by mouth as needed for anxiety.     losartan (COZAAR) 25 MG tablet Take 50 mg by mouth daily.      metFORMIN (GLUCOPHAGE) 850 MG tablet Take 850 mg by mouth as needed.     metoprolol succinate (TOPROL-XL) 50 MG 24 hr tablet Take 50 mg by mouth daily. Take with or immediately following a meal.     Omega-3 Fatty Acids (FISH OIL) 500 MG CAPS Take 500 mg by mouth daily.     No current facility-administered medications on file prior to visit.    Cardiovascular studies:  Exercise Sestamibi stress test 11/24/2020: Exercise nuclear stress test was performed using Bruce protocol. Patient reached 7 METS, and 83% of age predicted maximum heart rate. Exercise capacity was fair. No chest pain reported. Normal heart rate and hypertensive blood pressure response (peak BP 224/88 mmHg). Peak EKG/ECG demonstrated normal sinus tachycardia, <1 mm  ST depression in leads II, III, aVF, V4-V6, which is negative for ischemia. Normal myocardial perfusion. Stress LVEF 73%. Low risk study.  Echocardiogram 01/04/2019: Normal LV systolic function with EF 65%. Left ventricle cavity is normal in size. Mild concentric hypertrophy of the left ventricle. Normal global wall motion. Doppler evidence of grade I (impaired) diastolic dysfunction, normal LAP.  Left atrial cavity is mildly dilated. Mild (Grade I) mitral regurgitation. Normal right atrial pressure.   EKG 11/11/2020: Sinus rhythm 62 bpm  RSR(V1) -nondiagnostic Nonspecific T-abnormality  Lexiscan Myoview Stress Test 12/18/2018: Stress EKG is non-diagnostic, as this is pharmacological stress test. RAW images reveal mild breast attenuation artifact in the apical antieor wall. Normal myocardial perfusion. NLVEF 64%. Low risk study.   Lexiscan Myoview Stress Test 12/18/2018: Stress EKG is non-diagnostic, as this is pharmacological stress test. RAW images reveal mild breast attenuation artifact in the apical antieor wall. Normal myocardial perfusion. NLVEF 64%. Low risk study.  Recent labs: 10/2020: Glucose 99, BUN/Cr 17/0.77. EGFR 81. Na/K 146/4. Rest of the CMP normal H/H 38.7/12.8. MCV 86.8. Platelets 207 HbA1C 6.0 % Chol 170, TG 49, HDL 65, LDL 95 TSH 2.7 normal  10/19/2018: Glucose 103. BUN/Cr 18/0.7. eGFR 86. Na/K ?/4.Marland Kitchen H/H 13/39. MCV 86. Platelets 266. Chol 15, TG 72, HDL 35, LDL 88   Review of Systems  Cardiovascular:  Positive for chest pain. Negative for dyspnea on exertion, leg swelling, palpitations and syncope.          Vitals:   12/16/20 0941  BP: (!) 149/85  Pulse: 69  Resp: 16  Temp: 98 F (36.7 C)  SpO2: 96%    Body mass index is 31.95 kg/m. Filed Weights   12/16/20 0941  Weight: 192 lb (87.1 kg)     Objective:  Physical Exam Vitals and nursing note reviewed.  Constitutional:      General: She is not in acute distress. Neck:     Vascular: No  JVD.  Cardiovascular:     Rate and Rhythm: Normal rate and regular rhythm.     Pulses: Normal pulses.     Heart sounds: Normal heart sounds. No murmur heard. Pulmonary:     Effort: Pulmonary effort is normal.     Breath sounds: Normal breath sounds. No wheezing or rales.  Chest:     Chest wall: Tenderness present.         Assessment & Recommendations:   65 y.o. Caucasian female with hypertension, hyperlipidemia, [name], mild obesity, with chest pain.  Atypical chest pain: Increased frequency recently. Exercise nuclear stress test (10/2020) with hypertensive response, borderline EKG changes, but no SPECT evidence of ischemia/infarct. I wonder if her chest pain is due to suboptimal control of hypertension causing demand ischemia. Currently on losartan 50 mg, metoprolol succinate 50 mg Increase losartan to 100 mg daily. Check BMP in 1 week/  Okay to discontinue Aspirin. Continue statin.  F/u in 4 weeks  [place], MD [facility] Pager: [phone] Office: [phone] If no answer Cell [phone]

## 2020-12-16 ENCOUNTER — Other Ambulatory Visit: Payer: Self-pay

## 2020-12-16 ENCOUNTER — Ambulatory Visit: Payer: 59 | Admitting: Cardiology

## 2020-12-16 ENCOUNTER — Encounter: Payer: Self-pay | Admitting: Cardiology

## 2020-12-16 VITALS — BP 149/85 | HR 69 | Temp 98.0°F | Resp 16 | Ht 65.0 in | Wt 192.0 lb

## 2020-12-16 DIAGNOSIS — R072 Precordial pain: Secondary | ICD-10-CM

## 2020-12-16 DIAGNOSIS — I10 Essential (primary) hypertension: Secondary | ICD-10-CM

## 2020-12-16 MED ORDER — LOSARTAN POTASSIUM 100 MG PO TABS: 100.0000 mg | ORAL_TABLET | Freq: Every day | ORAL | 3 refills | Status: DC

## 2020-12-31 LAB — BASIC METABOLIC PANEL
BUN/Creatinine Ratio: 25 (ref 12–28)
BUN: 17 mg/dL (ref 8–27)
CO2: 24 mmol/L (ref 20–29)
Calcium: 9.5 mg/dL (ref 8.7–10.3)
Chloride: 104 mmol/L (ref 96–106)
Creatinine, Ser: 0.69 mg/dL (ref 0.57–1.00)
Glucose: 104 mg/dL — ABNORMAL HIGH (ref 65–99)
Potassium: 4.4 mmol/L (ref 3.5–5.2)
Sodium: 143 mmol/L (ref 134–144)
eGFR: 96 mL/min/{1.73_m2} (ref >59)

## 2021-01-15 ENCOUNTER — Other Ambulatory Visit: Payer: Self-pay

## 2021-01-15 ENCOUNTER — Encounter: Payer: Self-pay | Admitting: Cardiology

## 2021-01-15 ENCOUNTER — Ambulatory Visit: Payer: 59 | Admitting: Cardiology

## 2021-01-15 VITALS — BP 138/88 | HR 65 | Temp 97.8°F | Resp 16 | Ht 65.0 in | Wt 197.0 lb

## 2021-01-15 DIAGNOSIS — R072 Precordial pain: Secondary | ICD-10-CM

## 2021-01-15 DIAGNOSIS — I10 Essential (primary) hypertension: Secondary | ICD-10-CM

## 2021-01-15 DIAGNOSIS — E782 Mixed hyperlipidemia: Secondary | ICD-10-CM

## 2021-01-15 MED ORDER — ATORVASTATIN CALCIUM 20 MG PO TABS: 20.0000 mg | ORAL_TABLET | Freq: Every day | ORAL | 1 refills | Status: DC

## 2021-01-15 NOTE — Progress Notes (Signed)
  Patient referred by Ramachandran, Ajith, MD for chest tightness  Subjective:   [name], female    DOB: 11/12/1955, 65 y.o.   MRN: [id number]   Chief Complaint  Patient presents with   Precordial pain   Hypertension   Follow-up    4 week    HPI  65 y.o. Caucasian female with hypertension, hyperlipidemia, [name], mild obesity, with chest pain.  Chest pain symptoms have improved since better blood pressure control.   Current Outpatient Medications on File Prior to Visit  Medication Sig Dispense Refill   albuterol (VENTOLIN HFA) 108 (90 Base) MCG/ACT inhaler 1 PUFF AS NEEDED EVERY 6 HRS INHALATION 30 DAYS     aspirin 81 MG tablet Take 81 mg by mouth daily.      atorvastatin (LIPITOR) 10 MG tablet Take 10 mg by mouth daily.     Cholecalciferol (VITAMIN D3) 50 MCG (2000 UT) CHEW Chew by mouth 2 (two) times a day.     Dulaglutide (TRULICITY) 1.5 MG/0.5ML SOPN 1.5mg     LORazepam (ATIVAN) 0.5 MG tablet Take 0.5 mg by mouth as needed for anxiety.     losartan (COZAAR) 100 MG tablet Take 1 tablet (100 mg total) by mouth daily. 90 tablet 3   metFORMIN (GLUCOPHAGE-XR) 750 MG 24 hr tablet 1 tablet with evening meal     metoprolol succinate (TOPROL-XL) 50 MG 24 hr tablet Take 50 mg by mouth daily. Take with or immediately following a meal.     Omega-3 Fatty Acids (FISH OIL) 500 MG CAPS Take 500 mg by mouth daily.     No current facility-administered medications on file prior to visit.    Cardiovascular studies:  Exercise Sestamibi stress test 11/24/2020: Exercise nuclear stress test was performed using Bruce protocol. Patient reached 7 METS, and 83% of age predicted maximum heart rate. Exercise capacity was fair. No chest pain reported. Normal heart rate and hypertensive blood pressure response (peak BP 224/88 mmHg). Peak EKG/ECG demonstrated normal sinus tachycardia, <1 mm ST depression in leads II, III, aVF, V4-V6, which is negative for ischemia. Normal myocardial  perfusion. Stress LVEF 73%. Low risk study.  Echocardiogram 01/04/2019: Normal LV systolic function with EF 65%. Left ventricle cavity is normal in size. Mild concentric hypertrophy of the left ventricle. Normal global wall motion. Doppler evidence of grade I (impaired) diastolic dysfunction, normal LAP.  Left atrial cavity is mildly dilated. Mild (Grade I) mitral regurgitation. Normal right atrial pressure.   EKG 11/11/2020: Sinus rhythm 62 bpm  RSR(V1) -nondiagnostic Nonspecific T-abnormality  Lexiscan Myoview Stress Test 12/18/2018: Stress EKG is non-diagnostic, as this is pharmacological stress test. RAW images reveal mild breast attenuation artifact in the apical antieor wall. Normal myocardial perfusion. NLVEF 64%. Low risk study.   Lexiscan Myoview Stress Test 12/18/2018: Stress EKG is non-diagnostic, as this is pharmacological stress test. RAW images reveal mild breast attenuation artifact in the apical antieor wall. Normal myocardial perfusion. NLVEF 64%. Low risk study.  Recent labs: 12/30/2020: Glucose 104, BUN/Cr 17/0.69. EGFR 96. Na/K 143/4.4.   10/2020: Glucose 99, BUN/Cr 17/0.77. EGFR 81. Na/K 146/4. Rest of the CMP normal H/H 38.7/12.8. MCV 86.8. Platelets 207 HbA1C 6.0 % Chol 170, TG 49, HDL 65, LDL 95 TSH 2.7 normal  10/19/2018: Glucose 103. BUN/Cr 18/0.7. eGFR 86. Na/K ?/4.. H/H 13/39. MCV 86. Platelets 266. Chol 15, TG 72, HDL 35, LDL 88   Review of Systems  Cardiovascular:  Positive for chest pain. Negative for dyspnea on exertion,   leg swelling, palpitations and syncope.          Vitals:   01/15/21 1151 01/15/21 1158  BP: (!) 153/76 138/88  Pulse: 65   Resp:    Temp:    SpO2:      Body mass index is 32.78 kg/m. Filed Weights   01/15/21 1150  Weight: 197 lb (89.4 kg)     Objective:  Physical Exam Vitals and nursing note reviewed.  Constitutional:      General: She is not in acute distress. Neck:     Vascular: No JVD.   Cardiovascular:     Rate and Rhythm: Normal rate and regular rhythm.     Pulses: Normal pulses.     Heart sounds: Normal heart sounds. No murmur heard. Pulmonary:     Effort: Pulmonary effort is normal.     Breath sounds: Normal breath sounds. No wheezing or rales.  Chest:     Chest wall: Tenderness present.         Assessment & Recommendations:   66 y.o. Caucasian female with hypertension, hyperlipidemia, [name], mild obesity, with chest pain.  Atypical chest pain: Exercise nuclear stress test (10/2020) with hypertensive response, borderline EKG changes, but no SPECT evidence of ischemia/infarct. Suspected to be due to suboptimal control of hypertension causing demand ischemia. Continue losartan 100 mg, metoprolol succinate 50 mg  Mixed hyperlipidemia: Increase Lipitor to 20 mg daily.  Has upcoming physical scheduled with PCP. I will see her back in 3 months.  If things stable at that time, I will see her as needed.  F/u in 3 months  [place], MD [facility] Pager: [phone] Office: [phone] If no answer Cell [phone]

## 2021-04-09 IMAGING — CT CT PARANASAL SINUSES LIMITED
1 of 2 series · 8 of 11 positions shown, 10 images · non-contrast
Comparison: None.

CLINICAL DATA: Persistent cough and sinus drainage over the last 6
months.

EXAM:
CT PARANASAL SINUS LIMITED WITHOUT CONTRAST
TECHNIQUE: Non-contiguous multidetector CT images of the paranasal sinuses were
obtained in a single plane without contrast.

[Series 4: limited sinus st · axial · 0.25mm/px · z∈[+126,+196]mm · 8 of 10 slices shown, 10 images]
[im 2/10  brain]
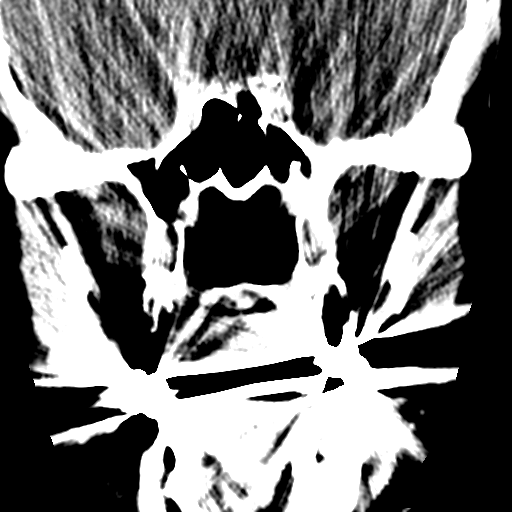
[im 2/10  bone]
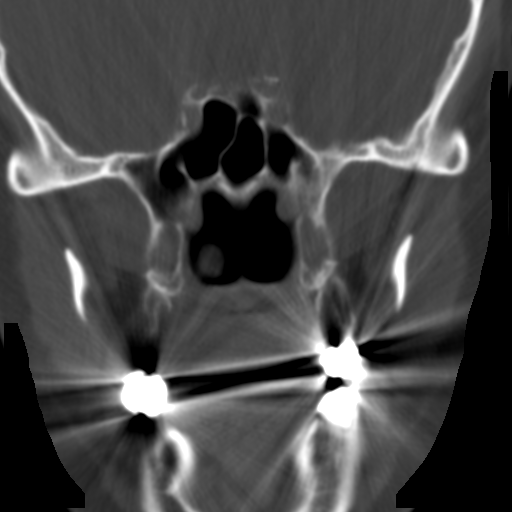
[im 3/10  bone]
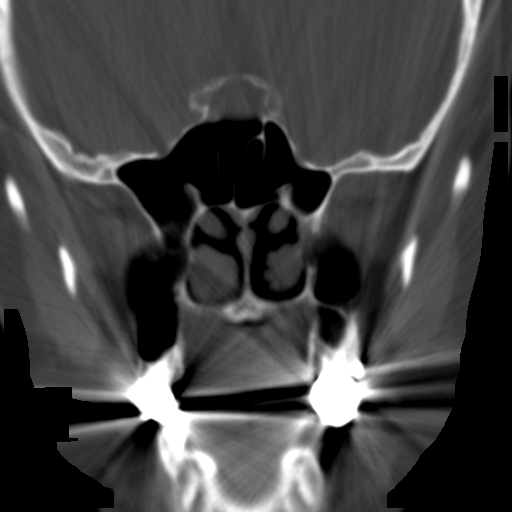
[im 4/10  bone]
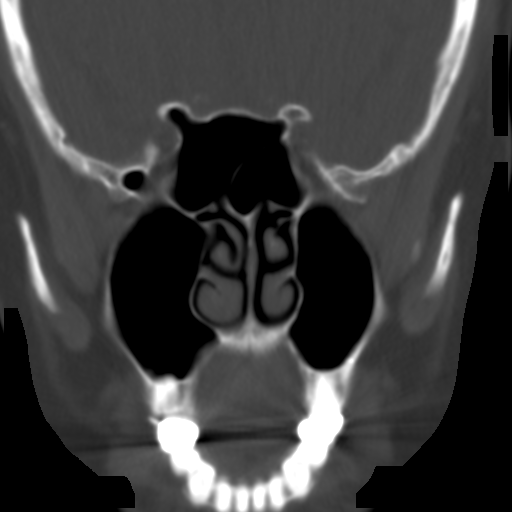
[im 5/10  bone]
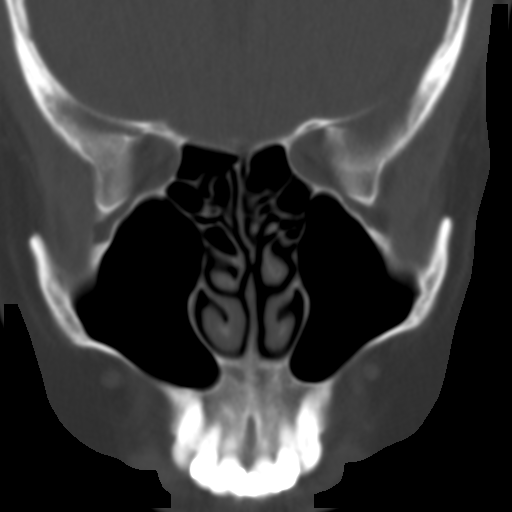
[im 6/10  brain]
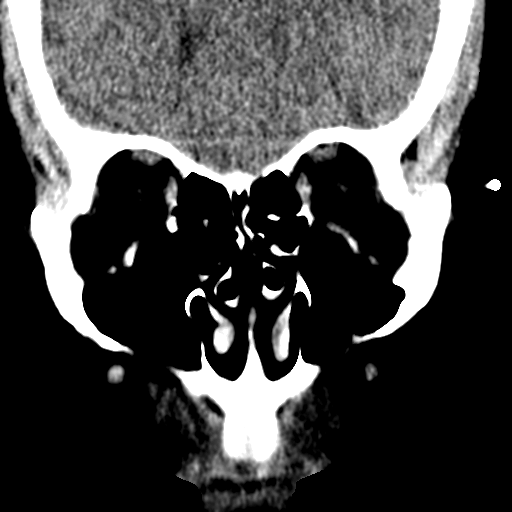
[im 6/10  bone]
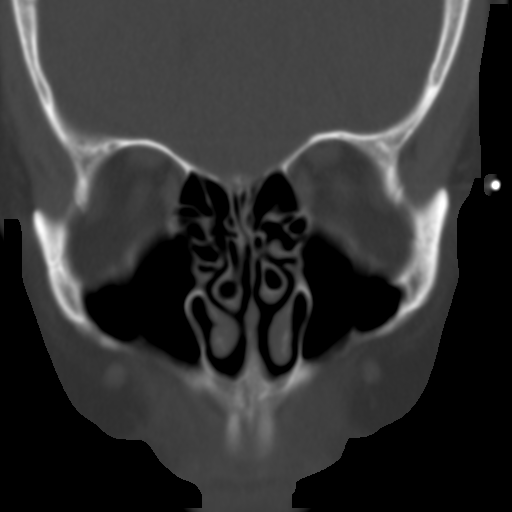
[im 7/10  bone]
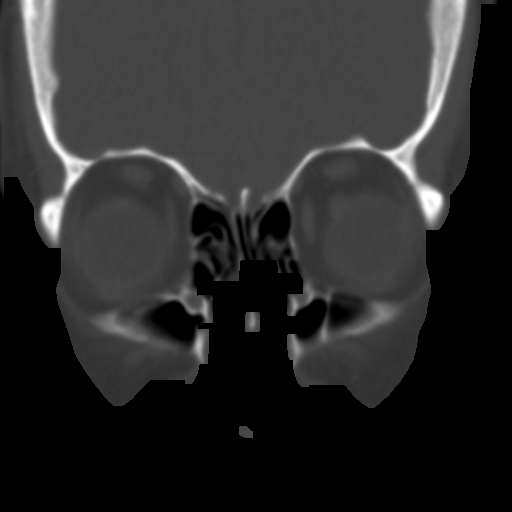
[im 8/10  bone]
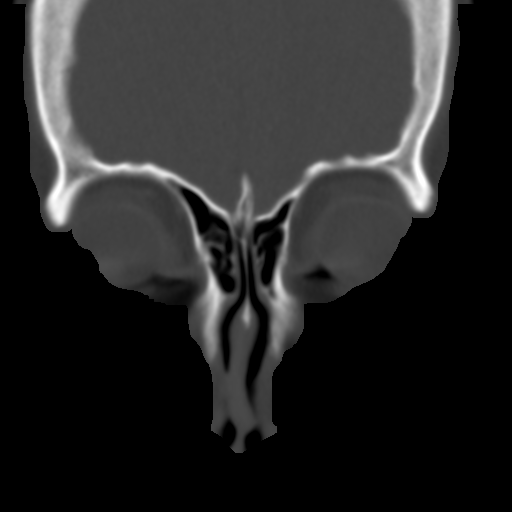
[im 9/10  bone]
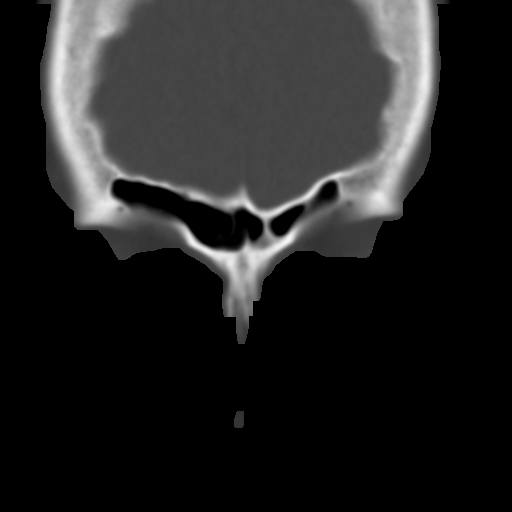

[8 of 11 positions shown; findings below may reference images not displayed]

FINDINGS: Visualized sinuses are clear without mucosal thickening or fluid. No
sign of polyp, cyst or mass. Nasal passageways appear patent.
IMPRESSION: Negative limited sinus evaluation.

## 2021-04-09 IMAGING — CT CT CHEST HIGH RESOLUTION W/O CM
2 of 5 series · 15 of 36 positions shown, 18 images · non-contrast
Comparison: 02/15/2010 chest CT angiogram.

CLINICAL DATA: Chronic nonproductive cough and intermittent dyspnea
for 6 months.

EXAM:
CT CHEST WITHOUT CONTRAST
TECHNIQUE: Multidetector CT imaging of the chest was performed following the
standard protocol without intravenous contrast. High resolution
imaging of the lungs, as well as inspiratory and expiratory imaging,
was performed.

[Series 2: high resolution · axial · 0.73mm/px · z∈[-354,-70]mm · 12 of 156 slices shown, 15 images]
[im 7/156  mediastinal]
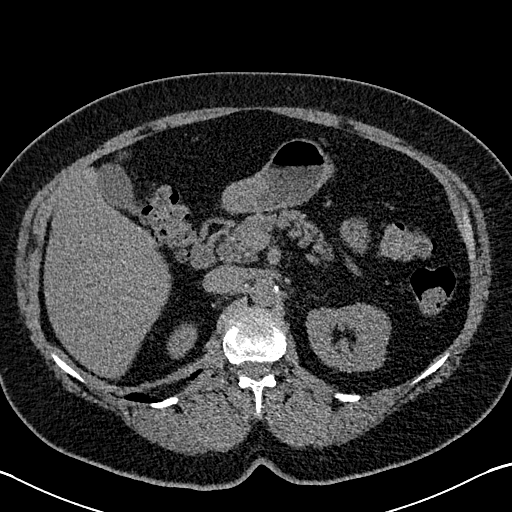
[im 7/156  lung]
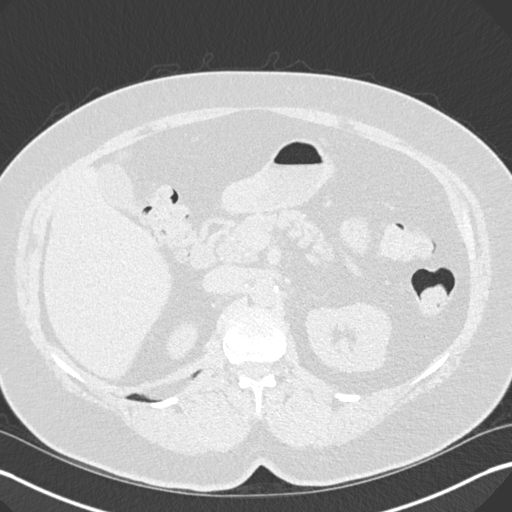
[im 21/156  lung]
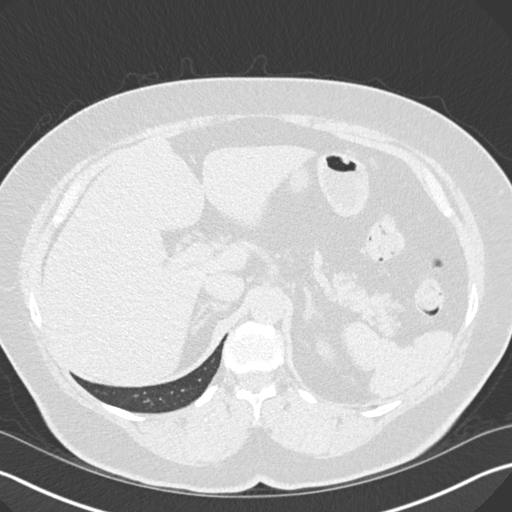
[im 34/156  lung]
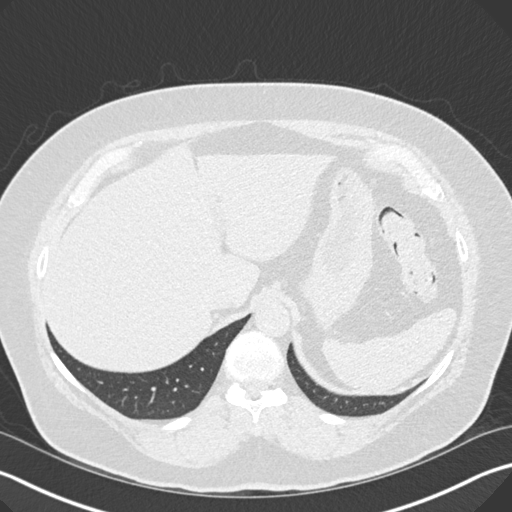
[im 48/156  lung]
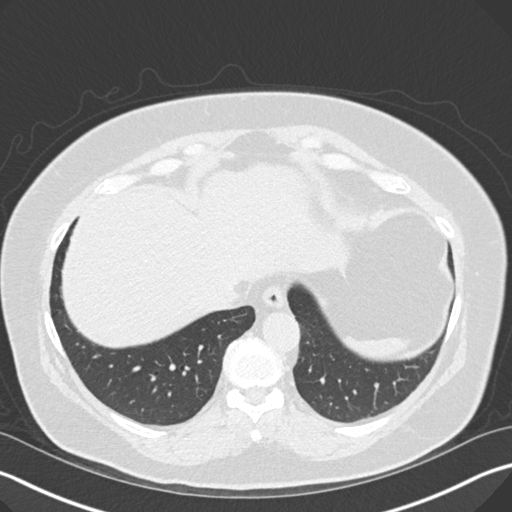
[im 61/156  mediastinal]
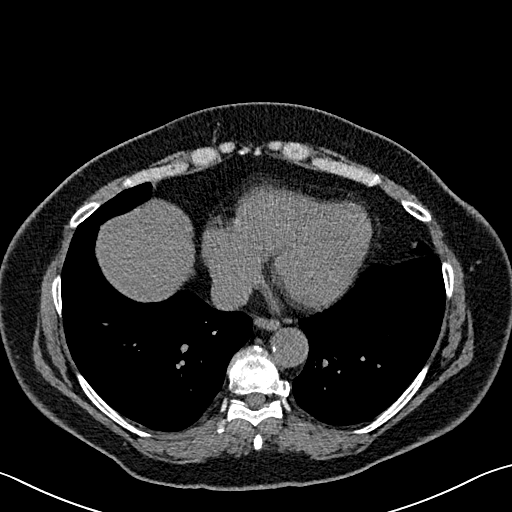
[im 61/156  lung]
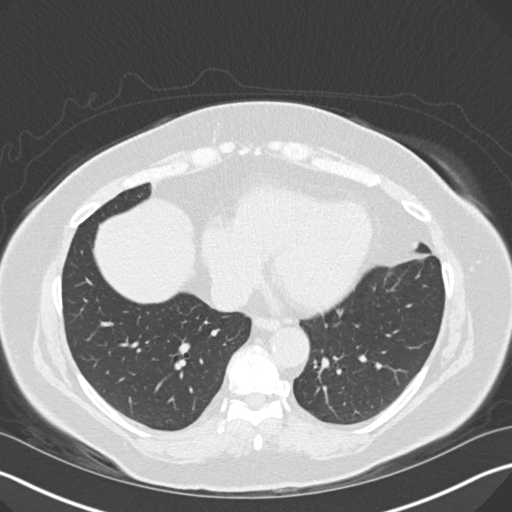
[im 75/156  lung]
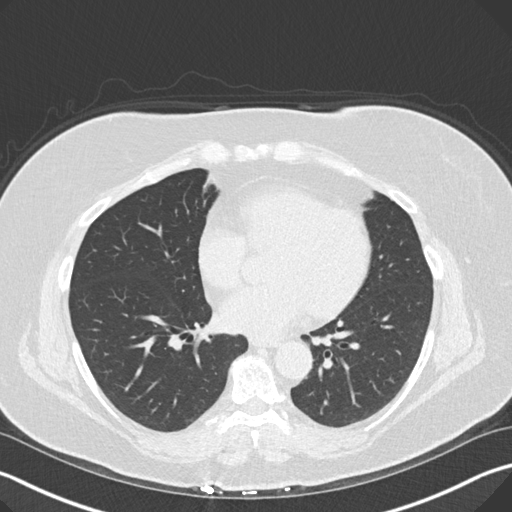
[im 81/156  lung]
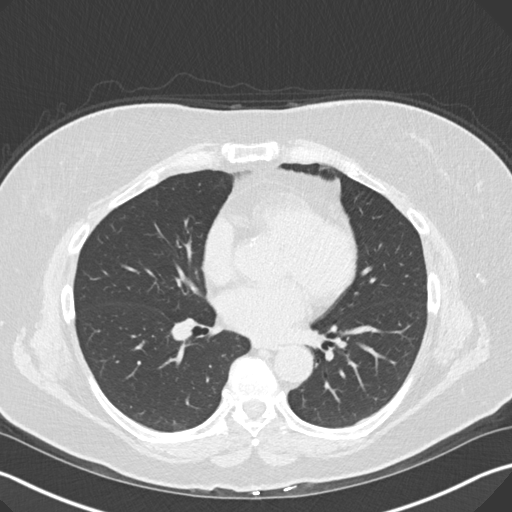
[im 95/156  lung]
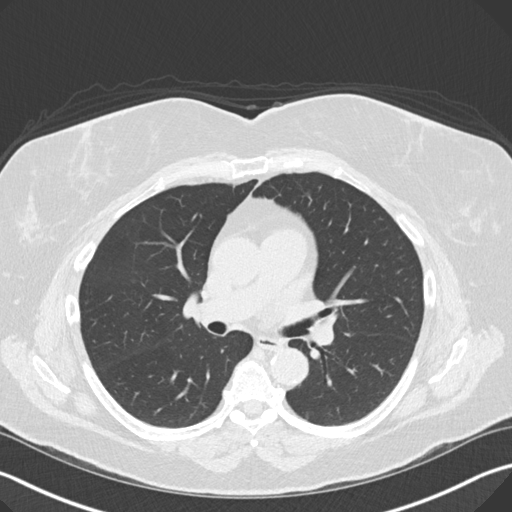
[im 108/156  mediastinal]
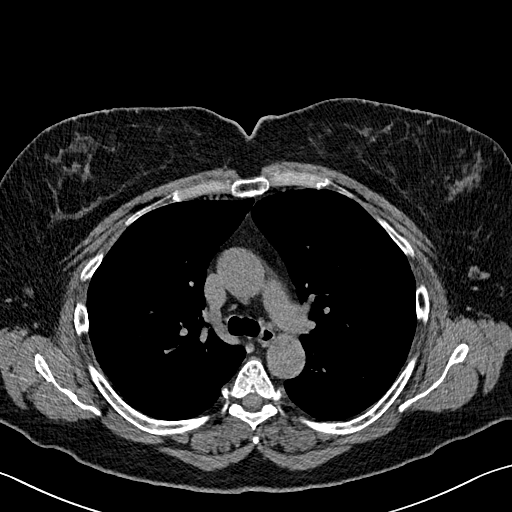
[im 108/156  lung]
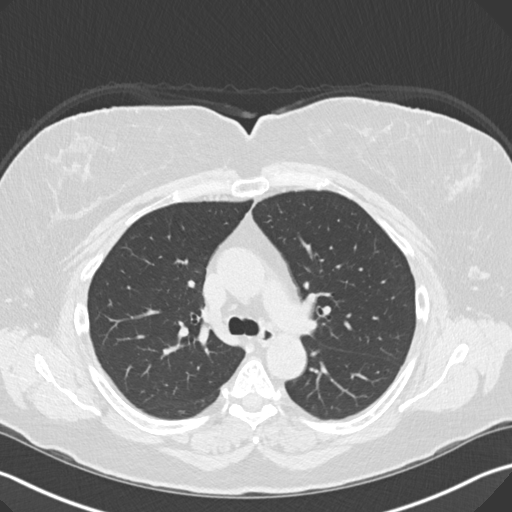
[im 122/156  lung]
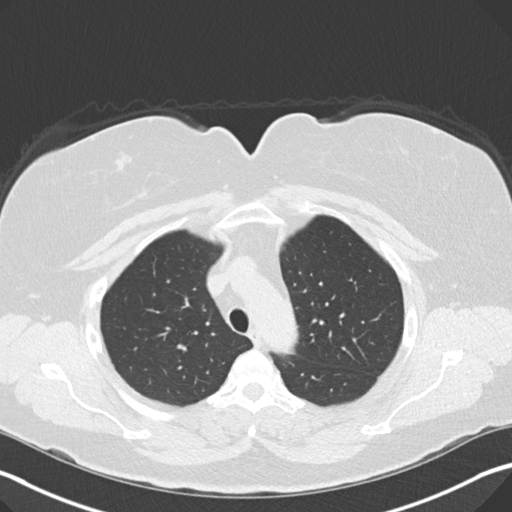
[im 135/156  lung]
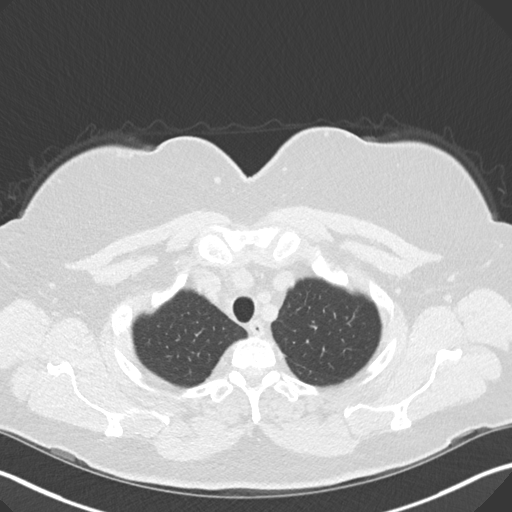
[im 149/156  lung]
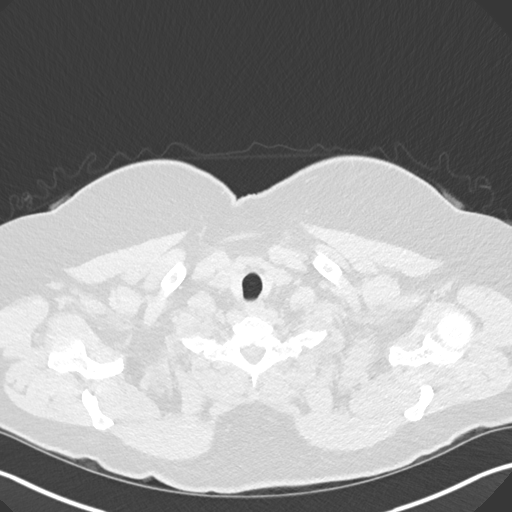

[Series 7: coronal · coronal · 0.62mm/px · 3 of 126 slices shown]
[im 26/126  lung]
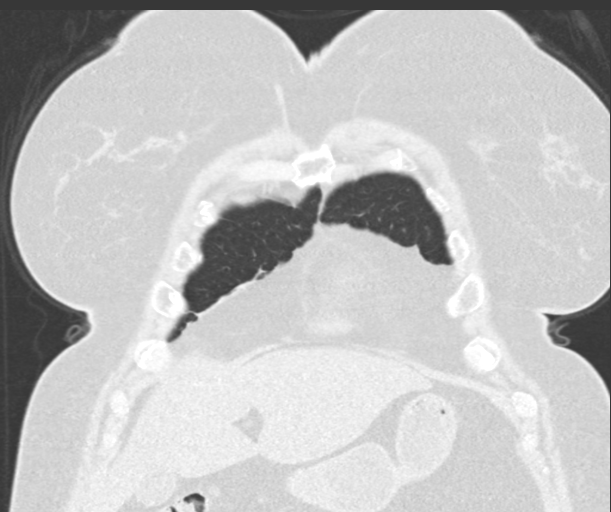
[im 51/126  lung]
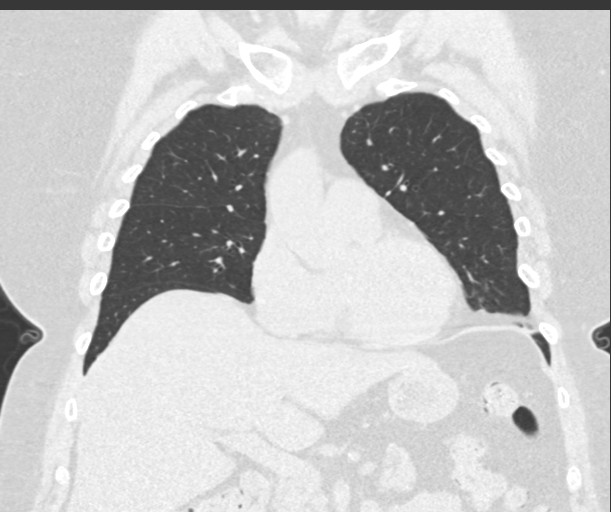
[im 76/126  lung]
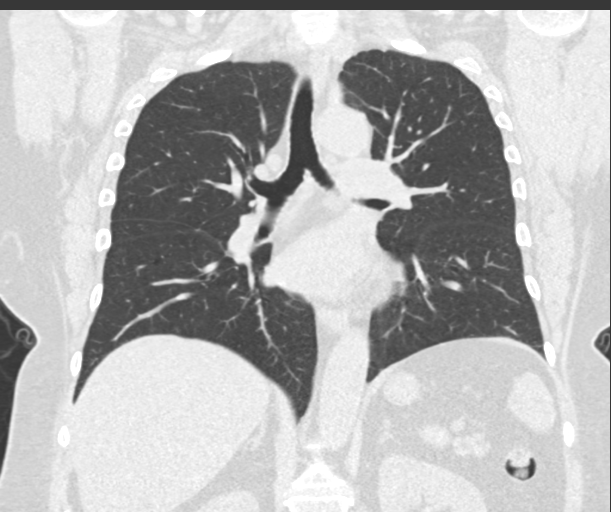

[15 of 36 positions shown; findings below may reference images not displayed]

FINDINGS: Cardiovascular: Normal heart size. No significant pericardial
effusion/thickening. Atherosclerotic nonaneurysmal thoracic aorta.
Normal caliber pulmonary arteries.

Mediastinum/Nodes: No discrete thyroid nodules. Unremarkable
esophagus. No pathologically enlarged axillary, mediastinal or hilar
lymph nodes, noting limited sensitivity for the detection of hilar
adenopathy on this noncontrast study.

Lungs/Pleura: No pneumothorax. No pleural effusion. No acute
consolidative airspace disease, lung masses or significant pulmonary
nodules. No significant regions of subpleural reticulation,
ground-glass attenuation, traction bronchiectasis, architectural
distortion or frank honeycombing. No significant air trapping or
evidence of tracheobronchomalacia on the expiration sequence.

Upper abdomen: Diffuse hepatic steatosis.

Musculoskeletal: No aggressive appearing focal osseous lesions. Mild
thoracic spondylosis.
IMPRESSION: 1. No evidence of interstitial lung disease. No active pulmonary
disease.
2. Diffuse hepatic steatosis.

Aortic Atherosclerosis ([id number].X).

## 2021-04-17 ENCOUNTER — Other Ambulatory Visit: Payer: Self-pay

## 2021-04-17 ENCOUNTER — Ambulatory Visit: Payer: 59 | Admitting: Cardiology

## 2021-04-17 ENCOUNTER — Encounter: Payer: Self-pay | Admitting: Cardiology

## 2021-04-17 VITALS — BP 168/77 | HR 68 | Temp 97.8°F | Resp 16 | Ht 65.0 in | Wt 203.0 lb

## 2021-04-17 DIAGNOSIS — E782 Mixed hyperlipidemia: Secondary | ICD-10-CM

## 2021-04-17 DIAGNOSIS — R072 Precordial pain: Secondary | ICD-10-CM

## 2021-04-17 DIAGNOSIS — I10 Essential (primary) hypertension: Secondary | ICD-10-CM

## 2021-04-17 NOTE — Progress Notes (Signed)
Patient referred by [name], MD for chest tightness  Subjective:   [name], female    DOB: [birth year], 65 y.o.   MRN: [id number]   Chief Complaint  Patient presents with   Precordial pain   Follow-up    3 month    HPI  65 y.o. Caucasian female with hypertension, hyperlipidemia, [name], mild obesity, with chest pain.  3 days, she has missed a few doses of her blood pressure medications.  Otherwise, blood pressure has been well controlled.  She is currently on losartan 100 mg daily and metoprolol succinate 50 mg daily.  She reports that she notices some chest discomfort when her blood pressure is elevated.   Current Outpatient Medications on File Prior to Visit  Medication Sig Dispense Refill   albuterol (VENTOLIN HFA) 108 (90 Base) MCG/ACT inhaler 1 PUFF AS NEEDED EVERY 6 HRS INHALATION 30 DAYS     atorvastatin (LIPITOR) 20 MG tablet Take 1 tablet (20 mg total) by mouth daily. 90 tablet 1   Cholecalciferol (VITAMIN D3) 50 MCG (2000 UT) CHEW Chew by mouth 2 (two) times a day.     Dulaglutide (TRULICITY) 1.5 WV/3.7TG SOPN 1.$RemoveBeforeD'5mg'[id number]$      LORazepam (ATIVAN) 0.5 MG tablet Take 0.5 mg by mouth as needed for anxiety.     losartan (COZAAR) 100 MG tablet Take 1 tablet (100 mg total) by mouth daily. 90 tablet 3   metFORMIN (GLUCOPHAGE-XR) 750 MG 24 hr tablet 1 tablet with evening meal     metoprolol succinate (TOPROL-XL) 50 MG 24 hr tablet Take 50 mg by mouth daily. Take with or immediately following a meal.     Omega-3 Fatty Acids (FISH OIL) 500 MG CAPS Take 500 mg by mouth daily.     No current facility-administered medications on file prior to visit.    Cardiovascular studies:  Exercise Sestamibi stress test 11/24/2020: Exercise nuclear stress test was performed using Bruce protocol. Patient reached 7 METS, and 83% of age predicted maximum heart rate. Exercise capacity was fair. No chest pain reported. Normal heart rate and hypertensive blood pressure  response (peak BP 224/88 mmHg). Peak EKG/ECG demonstrated normal sinus tachycardia, <1 mm ST depression in leads II, III, aVF, V4-V6, which is negative for ischemia. Normal myocardial perfusion. Stress LVEF 73%. Low risk study.  Echocardiogram 01/04/2019: Normal LV systolic function with EF 65%. Left ventricle cavity is normal in size. Mild concentric hypertrophy of the left ventricle. Normal global wall motion. Doppler evidence of grade I (impaired) diastolic dysfunction, normal LAP.  Left atrial cavity is mildly dilated. Mild (Grade I) mitral regurgitation. Normal right atrial pressure.   EKG 11/11/2020: Sinus rhythm 62 bpm  RSR(V1) -nondiagnostic Nonspecific T-abnormality  Lexiscan Myoview Stress Test 12/18/2018: Stress EKG is non-diagnostic, as this is pharmacological stress test. RAW images reveal mild breast attenuation artifact in the apical antieor wall. Normal myocardial perfusion. NLVEF 64%. Low risk study.   Lexiscan Myoview Stress Test 12/18/2018: Stress EKG is non-diagnostic, as this is pharmacological stress test. RAW images reveal mild breast attenuation artifact in the apical antieor wall. Normal myocardial perfusion. NLVEF 64%. Low risk study.  Recent labs: 12/30/2020: Glucose 104, BUN/Cr 17/0.69. EGFR 96. Na/K 143/4.4.   10/2020: Glucose 99, BUN/Cr 17/0.77. EGFR 81. Na/K 146/4. Rest of the CMP normal H/H 38.7/12.8. MCV 86.8. Platelets 207 HbA1C 6.0 % Chol 170, TG 49, HDL 65, LDL 95 TSH 2.7 normal  10/19/2018: Glucose 103. BUN/Cr 18/0.7. eGFR 86. Na/K ?/4.Marland Kitchen H/H 13/39. MCV 86.  Platelets 266. Chol 15, TG 72, HDL 35, LDL 88   Review of Systems  Cardiovascular:  Negative for chest pain, dyspnea on exertion, leg swelling, palpitations and syncope.          Vitals:   04/17/21 1410  BP: (!) 168/77  Pulse: 68  Resp: 16  Temp: 97.8 F (36.6 C)  SpO2: 95%    Body mass index is 33.78 kg/m. Filed Weights   04/17/21 1410  Weight: 203 lb (92.1 kg)      Objective:  Physical Exam Vitals and nursing note reviewed.  Constitutional:      General: She is not in acute distress. Neck:     Vascular: No JVD.  Cardiovascular:     Rate and Rhythm: Normal rate and regular rhythm.     Pulses: Normal pulses.     Heart sounds: Normal heart sounds. No murmur heard. Pulmonary:     Effort: Pulmonary effort is normal.     Breath sounds: Normal breath sounds. No wheezing or rales.  Chest:     Chest wall: No tenderness.  Musculoskeletal:     Right lower leg: No edema.     Left lower leg: No edema.         Assessment & Recommendations:   65 y.o. Caucasian female with hypertension, hyperlipidemia, [name], mild obesity, with chest pain.  Atypical chest pain: Exercise nuclear stress test (10/2020) with hypertensive response, borderline EKG changes, but no SPECT evidence of ischemia/infarct. Suspected to be due to suboptimal control of hypertension causing demand ischemia. Continue losartan 100 mg, metoprolol succinate 50 mg Have encouraged her to check her blood pressure regularly and bring her blood pressure monitor and blood pressure log to next visit.  If remains elevated, could either add amlodipine 5 mg daily, or switch metoprolol succinate 50 mg daily to labetalol 100 mg twice daily.  Mixed hyperlipidemia: Currently on Lipitor to 20 mg daily.    F/u in 4 weeks w/Celeste Cantwell, PA. If BP controlled, then f/u w/me in 1 year after that   Nigel Mormon, MD [facility] Pager: [phone] Office: [phone] If no answer Cell [phone]

## 2021-05-07 ENCOUNTER — Telehealth: Payer: Self-pay | Admitting: Pharmacist

## 2021-05-07 ENCOUNTER — Other Ambulatory Visit: Payer: Self-pay | Admitting: Cardiology

## 2021-05-07 DIAGNOSIS — R002 Palpitations: Secondary | ICD-10-CM

## 2021-05-07 DIAGNOSIS — I10 Essential (primary) hypertension: Secondary | ICD-10-CM

## 2021-05-07 MED ORDER — AMLODIPINE BESYLATE 5 MG PO TABS: 5.0000 mg | ORAL_TABLET | Freq: Every day | ORAL | 3 refills | Status: DC

## 2021-05-07 NOTE — Telephone Encounter (Signed)
Pt still having "flutter" sensations. Pt would like to come to the office tomorrow for an EKG to rule out any rhythm abnormalities.

## 2021-05-07 NOTE — Telephone Encounter (Signed)
Agree 

## 2021-05-07 NOTE — Telephone Encounter (Signed)
Pt called with concerns of elevated BP readings over the past day or so. BP currently of 176/86 HR: 75. Previous BP readings include, 157/121 HR: 75, 175/87 HR: 65, 164/92 HR: 70. Pt currently on losartan 100 mg which she took this morning and metoprolol 50 mg which she normally takes in the evening. Pt reports feeling worried and having "fluttering" sensation. Denies any complains of lightheadedness, dizziness, SOB, syncope concerns, chest pain. Denies any severe headache, numbness, weakness. At last OV, pt was recommended to monitor her home BP readings and to review the readings with Celeste at upcoming 05/18/21 to consider add-on therapy for BP management. Pt open to starting amlodipine 5 mg to help improve her BP and chest pain concerns. Pt to continue monitoring her home BP readings in the meantime and review the results at next OV with [place]. Pt aware to notify the office or go to the ER if symptoms of severe chest pain, lightheadedness, dizziness, continues to persist or worsen. Rx pended for approval.

## 2021-05-07 NOTE — Telephone Encounter (Signed)
Ongoing palpitations symptoms. Recommend two week cardiac telemetry. Order placed. This can replace EKG.  Thanks MJP

## 2021-05-08 ENCOUNTER — Other Ambulatory Visit: Payer: 59

## 2021-05-08 ENCOUNTER — Other Ambulatory Visit: Payer: Self-pay

## 2021-05-08 DIAGNOSIS — R002 Palpitations: Secondary | ICD-10-CM

## 2021-05-08 NOTE — Telephone Encounter (Signed)
Can you call patient and set her up for 2 week monitor on Tuesday per MP Thanks

## 2021-05-13 NOTE — Progress Notes (Deleted)
Patient referred by Katherine Fick, MD for chest tightness  Subjective:   [name], female    DOB: February 09, 1956, 65 y.o.   MRN: [id number]   No chief complaint on file.   HPI  64 y.o. Caucasian female with hypertension, hyperlipidemia, [name], mild obesity, with chest pain.  Patient presents for 4-week follow-up of hypertension.  Last office visit encourage patient to monitor blood pressure regularly at home and bring a written log to her next visit to reevaluate hypertension control. ***  ***  3 days, she has missed a few doses of her blood pressure medications.  Otherwise, blood pressure has been well controlled.  She is currently on losartan 100 mg daily and metoprolol succinate 50 mg daily.  She reports that she notices some chest discomfort when her blood pressure is elevated.   Current Outpatient Medications on File Prior to Visit  Medication Sig Dispense Refill   albuterol (VENTOLIN HFA) 108 (90 Base) MCG/ACT inhaler 1 PUFF AS NEEDED EVERY 6 HRS INHALATION 30 DAYS     amLODipine (NORVASC) 5 MG tablet Take 1 tablet (5 mg total) by mouth daily. 90 tablet 3   atorvastatin (LIPITOR) 20 MG tablet Take 1 tablet (20 mg total) by mouth daily. 90 tablet 1   Cholecalciferol (VITAMIN D3) 50 MCG (2000 UT) CHEW Chew by mouth 2 (two) times a day.     Dulaglutide (TRULICITY) 1.5 MG/0.5ML SOPN 1.5mg      LORazepam (ATIVAN) 0.5 MG tablet Take 0.5 mg by mouth as needed for anxiety.     losartan (COZAAR) 100 MG tablet Take 1 tablet (100 mg total) by mouth daily. 90 tablet 3   metFORMIN (GLUCOPHAGE-XR) 750 MG 24 hr tablet 1 tablet with evening meal     metoprolol succinate (TOPROL-XL) 50 MG 24 hr tablet Take 50 mg by mouth daily. Take with or immediately following a meal.     Omega-3 Fatty Acids (FISH OIL) 500 MG CAPS Take 500 mg by mouth daily.     No current facility-administered medications on file prior to visit.    Cardiovascular studies:  Exercise Sestamibi  stress test 11/24/2020: Exercise nuclear stress test was performed using Bruce protocol. Patient reached 7 METS, and 83% of age predicted maximum heart rate. Exercise capacity was fair. No chest pain reported. Normal heart rate and hypertensive blood pressure response (peak BP 224/88 mmHg). Peak EKG/ECG demonstrated normal sinus tachycardia, <1 mm ST depression in leads II, III, aVF, V4-V6, which is negative for ischemia. Normal myocardial perfusion. Stress LVEF 73%. Low risk study.  Echocardiogram 01/04/2019: Normal LV systolic function with EF 65%. Left ventricle cavity is normal in size. Mild concentric hypertrophy of the left ventricle. Normal global wall motion. Doppler evidence of grade I (impaired) diastolic dysfunction, normal LAP.  Left atrial cavity is mildly dilated. Mild (Grade I) mitral regurgitation. Normal right atrial pressure.   EKG 11/11/2020: Sinus rhythm 62 bpm  RSR(V1) -nondiagnostic Nonspecific T-abnormality  Lexiscan Myoview Stress Test 12/18/2018: Stress EKG is non-diagnostic, as this is pharmacological stress test. RAW images reveal mild breast attenuation artifact in the apical antieor wall. Normal myocardial perfusion. NLVEF 64%. Low risk study.   Lexiscan Myoview Stress Test 12/18/2018: Stress EKG is non-diagnostic, as this is pharmacological stress test. RAW images reveal mild breast attenuation artifact in the apical antieor wall. Normal myocardial perfusion. NLVEF 64%. Low risk study.  Recent labs: 12/30/2020: Glucose 104, BUN/Cr 17/0.69. EGFR 96. Na/K 143/4.4.   10/2020: Glucose 99, BUN/Cr 17/0.77. EGFR  81. Na/K 146/4. Rest of the CMP normal H/H 38.7/12.8. MCV 86.8. Platelets 207 HbA1C 6.0 % Chol 170, TG 49, HDL 65, LDL 95 TSH 2.7 normal  10/19/2018: Glucose 103. BUN/Cr 18/0.7. eGFR 86. Na/K ?/4.Marland Kitchen H/H 13/39. MCV 86. Platelets 266. Chol 15, TG 72, HDL 35, LDL 88   Review of Systems  Cardiovascular:  Negative for chest pain, dyspnea on exertion, leg  swelling, palpitations and syncope.          There were no vitals filed for this visit.   There is no height or weight on file to calculate BMI. There were no vitals filed for this visit.    Objective:  Physical Exam Vitals and nursing note reviewed.  Constitutional:      General: She is not in acute distress. Neck:     Vascular: No JVD.  Cardiovascular:     Rate and Rhythm: Normal rate and regular rhythm.     Pulses: Normal pulses.     Heart sounds: Normal heart sounds. No murmur heard. Pulmonary:     Effort: Pulmonary effort is normal.     Breath sounds: Normal breath sounds. No wheezing or rales.  Chest:     Chest wall: No tenderness.  Musculoskeletal:     Right lower leg: No edema.     Left lower leg: No edema.         Assessment & Recommendations:   65 y.o. Caucasian female with hypertension, hyperlipidemia, [name], mild obesity, with chest pain.  Atypical chest pain:*** Exercise nuclear stress test (10/2020) with hypertensive response, borderline EKG changes, but no SPECT evidence of ischemia/infarct. Suspected to be due to suboptimal control of hypertension causing demand ischemia. Continue losartan 100 mg, metoprolol succinate 50 mg Have encouraged her to check her blood pressure regularly and bring her blood pressure monitor and blood pressure log to next visit.  If remains elevated, could either add amlodipine 5 mg daily, or switch metoprolol succinate 50 mg daily to labetalol 100 mg twice daily.  Mixed hyperlipidemia: Currently on Lipitor to 20 mg daily.    F/u in 4 weeks w/Lorelle Macaluso, PA. If BP controlled, then f/u w/me in 1 year after that   Nigel Mormon, MD [facility] Pager: [phone] Office: [phone] If no answer Cell [phone]

## 2021-05-18 ENCOUNTER — Ambulatory Visit: Payer: 59 | Admitting: Student

## 2021-05-18 DIAGNOSIS — I10 Essential (primary) hypertension: Secondary | ICD-10-CM

## 2021-06-01 ENCOUNTER — Ambulatory Visit: Payer: 59 | Admitting: Student

## 2021-06-05 ENCOUNTER — Ambulatory Visit: Payer: 59 | Admitting: Student

## 2021-06-05 ENCOUNTER — Encounter: Payer: Self-pay | Admitting: Student

## 2021-06-05 ENCOUNTER — Other Ambulatory Visit: Payer: Self-pay

## 2021-06-05 VITALS — BP 135/66 | HR 66 | Temp 98.0°F | Ht 65.0 in | Wt 202.0 lb

## 2021-06-05 DIAGNOSIS — E782 Mixed hyperlipidemia: Secondary | ICD-10-CM

## 2021-06-05 DIAGNOSIS — I10 Essential (primary) hypertension: Secondary | ICD-10-CM

## 2021-06-05 DIAGNOSIS — I491 Atrial premature depolarization: Secondary | ICD-10-CM

## 2021-06-05 DIAGNOSIS — I493 Ventricular premature depolarization: Secondary | ICD-10-CM

## 2021-06-05 NOTE — Progress Notes (Addendum)
Patient referred by [name], MD for chest tightness  Subjective:   [name], female    DOB: 10-18-1955, 65 y.o.   MRN: [id number]   Chief Complaint  Patient presents with   Results   Hypertension   Follow-up    HPI  65 y.o. Caucasian female with hypertension, hyperlipidemia, [name], mild obesity, with chest pain.  Patient presents for 4-week follow-up of hypertension.  Last office visit encourage patient to monitor blood pressure regularly at home and bring a written log to her next visit to reevaluate hypertension control.  Patient reports home blood pressure readings have been averaging 130-135/80s mmHg.  She is focused on reducing sodium intake.  Patient had also had Zio patch monitor placed due to palpitations.  She states palpitations have improved, occurring less frequently.  Denies chest pain, dyspnea, syncope, near-syncope.   Current Outpatient Medications on File Prior to Visit  Medication Sig Dispense Refill   albuterol (VENTOLIN HFA) 108 (90 Base) MCG/ACT inhaler 1 PUFF AS NEEDED EVERY 6 HRS INHALATION 30 DAYS     amLODipine (NORVASC) 5 MG tablet Take 1 tablet (5 mg total) by mouth daily. 90 tablet 3   atorvastatin (LIPITOR) 20 MG tablet Take 1 tablet (20 mg total) by mouth daily. 90 tablet 1   Cholecalciferol (VITAMIN D3) 50 MCG (2000 UT) CHEW Chew by mouth 2 (two) times a day.     Dulaglutide (TRULICITY) 1.5 HE/5.2DP SOPN 1.107m     LORazepam (ATIVAN) 0.5 MG tablet Take 0.5 mg by mouth as needed for anxiety.     losartan (COZAAR) 100 MG tablet Take 1 tablet (100 mg total) by mouth daily. 90 tablet 3   metFORMIN (GLUCOPHAGE-XR) 750 MG 24 hr tablet 1 tablet with evening meal     metoprolol succinate (TOPROL-XL) 50 MG 24 hr tablet Take 50 mg by mouth daily. Take with or immediately following a meal.     No current facility-administered medications on file prior to visit.    Cardiovascular studies: Ambulatory cardiac telemetry 12 days  (05/08/2021 - 05/20/2021): Predominant underlying rhythm was sinus.  Rare PACs and PVCs.  13 episodes of supraventricular tachycardia, longest lasting 12.8 seconds.  Patient triggered events correlated with normal sinus rhythm (66-170 bpm), PACs and PVCs.  No evidence of significant cardiac arrhythmias.  Exercise Sestamibi stress test 11/24/2020: Exercise nuclear stress test was performed using Bruce protocol. Patient reached 7 METS, and 83% of age predicted maximum heart rate. Exercise capacity was fair. No chest pain reported. Normal heart rate and hypertensive blood pressure response (peak BP 224/88 mmHg). Peak EKG/ECG demonstrated normal sinus tachycardia, <1 mm ST depression in leads II, III, aVF, V4-V6, which is negative for ischemia. Normal myocardial perfusion. Stress LVEF 73%. Low risk study.  Echocardiogram 01/04/2019: Normal LV systolic function with EF 65%. Left ventricle cavity is normal in size. Mild concentric hypertrophy of the left ventricle. Normal global wall motion. Doppler evidence of grade I (impaired) diastolic dysfunction, normal LAP.  Left atrial cavity is mildly dilated. Mild (Grade I) mitral regurgitation. Normal right atrial pressure.   EKG 11/11/2020: Sinus rhythm 62 bpm  RSR(V1) -nondiagnostic Nonspecific T-abnormality  Lexiscan Myoview Stress Test 12/18/2018: Stress EKG is non-diagnostic, as this is pharmacological stress test. RAW images reveal mild breast attenuation artifact in the apical antieor wall. Normal myocardial perfusion. NLVEF 64%. Low risk study.  Lexiscan Myoview Stress Test 12/18/2018: Stress EKG is non-diagnostic, as this is pharmacological stress test. RAW images reveal mild breast attenuation  artifact in the apical antieor wall. Normal myocardial perfusion. NLVEF 64%. Low risk study.  Recent labs: 12/30/2020: Glucose 104, BUN/Cr 17/0.69. EGFR 96. Na/K 143/4.4.   10/2020: Glucose 99, BUN/Cr 17/0.77. EGFR 81. Na/K 146/4. Rest of the CMP  normal H/H 38.7/12.8. MCV 86.8. Platelets 207 HbA1C 6.0 % Chol 170, TG 49, HDL 65, LDL 95 TSH 2.7 normal  10/19/2018: Glucose 103. BUN/Cr 18/0.7. eGFR 86. Na/K ?/4.Marland Kitchen H/H 13/39. MCV 86. Platelets 266. Chol 15, TG 72, HDL 35, LDL 88   Review of Systems  Cardiovascular:  Negative for chest pain, dyspnea on exertion, leg swelling, palpitations and syncope.          Vitals:   06/05/21 1138  BP: 135/66  Pulse: 66  Temp: 98 F (36.7 C)  SpO2: 97%    Body mass index is 33.61 kg/m. Filed Weights   06/05/21 1138  Weight: 202 lb (91.6 kg)     Objective:  Physical Exam Vitals and nursing note reviewed.  Constitutional:      General: She is not in acute distress. Neck:     Vascular: No JVD.  Cardiovascular:     Rate and Rhythm: Normal rate and regular rhythm.     Pulses: Normal pulses.     Heart sounds: Normal heart sounds. No murmur heard. Pulmonary:     Effort: Pulmonary effort is normal.     Breath sounds: Normal breath sounds. No wheezing or rales.  Chest:     Chest wall: No tenderness.  Musculoskeletal:     Right lower leg: No edema.     Left lower leg: No edema.       Assessment & Recommendations:   65 y.o. Caucasian female with hypertension, hyperlipidemia, [name], mild obesity, with chest pain.  Hypertension/Atypical chest pain: Exercise nuclear stress test (10/2020) with hypertensive response, borderline EKG changes, but no SPECT evidence of ischemia/infarct. Suspected to be due to suboptimal control of hypertension causing demand ischemia. Blood pressure control has improved with sodium reduction.  Blood pressure is well controlled on home monitor, will not make changes to medications at this time. Continue losartan and metoprolol.  Palpitations: Reviewed and discussed results of cardiac monitor, details above No significant cardiac arrhythmias.  Patient's symptoms correlated with normal sinus rhythm and PACs/PVCs. Palpitations have improved since  last office visit.  Counseled patient regarding management options including up titration of beta-blocker therapy, she wishes to hold off at this time.  Mixed hyperlipidemia: Currently on Lipitor to 20 mg daily.    F/u in 6 months, sooner if needed, for hypertension, hyperlipidemia, PAC/PVC.    Alethia Berthold, PA-C 06/05/2021, 12:09 PM Office: [phone]  Addendum 06/05/2021:  Dr. Virgina Jock independently reviewed results of Zio monitor. His finalized results as follows:   Mobile cardiac telemetry 11 days 05/08/2021 - 05/20/2021: Dominant rhythm: Sinus. HR 56-137 bpm. Avg HR 82 bpm. 13 episodes of SVT, fastest at 193 bpm for 7.5 secs, longest for 12.8 secs at 160 bpm., without any reported symptoms. <1% isolated SVE, couplet/triplets. 0 episodes of VT <1% isolated VE, couplets. No triplets. No atrial fibrillation/atrial flutter/VT/high grade AV block, sinus pause >3sec noted. 114 patient triggered events, mostly wile in sinus rhythm.  Patient is aware and discussed as above.    Alethia Berthold, PA-C 06/05/2021, 3:34 PM Office: [phone]

## 2021-06-21 ENCOUNTER — Other Ambulatory Visit: Payer: Self-pay | Admitting: Cardiology

## 2021-06-21 DIAGNOSIS — E782 Mixed hyperlipidemia: Secondary | ICD-10-CM

## 2021-08-03 ENCOUNTER — Telehealth: Payer: Self-pay

## 2021-08-03 NOTE — Telephone Encounter (Signed)
Patient called and stated that after starting Amlodipine she has been having pain and swelling in her ankles and feet and now the swelling has gone up to her knees. Patient also stated that she did not take it all weekend and has been doing better and the swelling and the pain have gone down, but not completely gone away. Please advise.

## 2021-08-05 NOTE — Telephone Encounter (Signed)
Please have her stop the amlodpine. What has her blood pressure been?

## 2021-08-05 NOTE — Telephone Encounter (Signed)
Patient has only checked it on Sunday 140/88 and feels much better since she stopped it on Saturday. She is however, keeping a close eye on her BP and symptoms.

## 2021-11-12 NOTE — Progress Notes (Signed)
External labs 11/05/2021: Hgb 12.6, HCT 38.3, MCV 87.2, platelet 300 ALT 29, AST 14, BUN 20, creatinine 0.77, potassium 4.7, sodium 145, GFR >60 A1c 6.1% Total cholesterol 195, HDL 52, LDL 122, triglycerides 57

## 2021-12-03 ENCOUNTER — Ambulatory Visit: Payer: 59 | Admitting: Student

## 2022-01-07 ENCOUNTER — Encounter: Payer: Self-pay | Admitting: Student

## 2022-01-07 ENCOUNTER — Ambulatory Visit: Payer: 59 | Admitting: Student

## 2022-01-07 VITALS — BP 152/82 | HR 78 | Temp 98.4°F | Resp 17 | Ht 65.0 in | Wt 194.0 lb

## 2022-01-07 DIAGNOSIS — I10 Essential (primary) hypertension: Secondary | ICD-10-CM

## 2022-01-07 DIAGNOSIS — E782 Mixed hyperlipidemia: Secondary | ICD-10-CM

## 2022-01-07 DIAGNOSIS — R002 Palpitations: Secondary | ICD-10-CM

## 2022-01-07 MED ORDER — LOSARTAN POTASSIUM 100 MG PO TABS: 100.0000 mg | ORAL_TABLET | Freq: Every day | ORAL | 3 refills | Status: DC

## 2022-01-07 NOTE — Progress Notes (Signed)
Patient referred by [name], MD for chest tightness  Subjective:   [name], female    DOB: 12/06/1955, 66 y.o.   MRN: [id number]   Chief Complaint  Patient presents with   Hypertension   hld   Palpitations    6 month    HPI  66 y.o. Caucasian female with hypertension, hyperlipidemia, [name], mild obesity, with chest pain.  Cardiac monitor in 04/2021 revealed asymptomatic PACs/PVCs as well as SVT.  Patient was last in the office 06/05/2021 at which time she was stable from a cardiac standpoint and no changes were made.  Patient now presents for 84-monthfollow-up.  Patient has recently been evaluated by endocrinology who increased statin therapy and plans to repeat lipid profile testing in 2 months.  Since last office visit patient has inadvertently discontinued losartan 100 mg and went back to losartan 50 mg p.o. daily.  She has not been monitoring blood pressure at home, hypertension is uncontrolled at today's office visit.  Denies chest pain, dyspnea, syncope, near-syncope, palpitations.   Current Outpatient Medications on File Prior to Visit  Medication Sig Dispense Refill   albuterol (VENTOLIN HFA) 108 (90 Base) MCG/ACT inhaler 1 PUFF AS NEEDED EVERY 6 HRS INHALATION 30 DAYS     atorvastatin (LIPITOR) 40 MG tablet Take 40 mg by mouth daily.     Cholecalciferol (VITAMIN D3) 50 MCG (2000 UT) CHEW Chew by mouth 2 (two) times a day.     Dulaglutide (TRULICITY) 1.5 MUX/3.2GMSOPN 1.595m    HYDROcodone-acetaminophen (NORCO/VICODIN) 5-325 MG tablet Take 1 tablet by mouth as needed.     ibuprofen (ADVIL) 600 MG tablet Take 1 tablet by mouth as needed.     LORazepam (ATIVAN) 0.5 MG tablet Take 0.5 mg by mouth as needed for anxiety.     metFORMIN (GLUCOPHAGE-XR) 750 MG 24 hr tablet 1 tablet with evening meal     metoprolol succinate (TOPROL-XL) 50 MG 24 hr tablet Take 50 mg by mouth daily. Take with or immediately following a meal.     tamsulosin (FLOMAX)  0.4 MG CAPS capsule Take 1 capsule by mouth as needed.     No current facility-administered medications on file prior to visit.    Cardiovascular studies: EKG 01/07/2022:  Sinus rhythm at a rate of 76 bpm.  Normal axis.  Nonspecific T wave abnormality.  Compared EKG 11/11/2020, no significant change.  Mobile cardiac telemetry 11 days 05/08/2021 - 05/20/2021: Dominant rhythm: Sinus. HR 56-137 bpm. Avg HR 82 bpm. 13 episodes of SVT, fastest at 193 bpm for 7.5 secs, longest for 12.8 secs at 160 bpm., without any reported symptoms. <1% isolated SVE, couplet/triplets. 0 episodes of VT <1% isolated VE, couplets. No triplets. No atrial fibrillation/atrial flutter/VT/high grade AV block, sinus pause >3sec noted. 114 patient triggered events, mostly wile in sinus rhythm  Exercise Sestamibi stress test 11/24/2020: Exercise nuclear stress test was performed using Bruce protocol. Patient reached 7 METS, and 83% of age predicted maximum heart rate. Exercise capacity was fair. No chest pain reported. Normal heart rate and hypertensive blood pressure response (peak BP 224/88 mmHg). Peak EKG/ECG demonstrated normal sinus tachycardia, <1 mm ST depression in leads II, III, aVF, V4-V6, which is negative for ischemia. Normal myocardial perfusion. Stress LVEF 73%. Low risk study.  Echocardiogram 01/04/2019: Normal LV systolic function with EF 65%. Left ventricle cavity is normal in size. Mild concentric hypertrophy of the left ventricle. Normal global wall motion. Doppler evidence of grade I (  impaired) diastolic dysfunction, normal LAP.  Left atrial cavity is mildly dilated. Mild (Grade I) mitral regurgitation. Normal right atrial pressure.   EKG 11/11/2020: Sinus rhythm 62 bpm  RSR(V1) -nondiagnostic Nonspecific T-abnormality  Lexiscan Myoview Stress Test 12/18/2018: Stress EKG is non-diagnostic, as this is pharmacological stress test. RAW images reveal mild breast attenuation artifact in the apical  antieor wall. Normal myocardial perfusion. NLVEF 64%. Low risk study.  Lexiscan Myoview Stress Test 12/18/2018: Stress EKG is non-diagnostic, as this is pharmacological stress test. RAW images reveal mild breast attenuation artifact in the apical antieor wall. Normal myocardial perfusion. NLVEF 64%. Low risk study.  Recent labs: 11/05/2021: Hgb 12.6, HCT 38.3, MCV 87.2, platelet 300 ALT 29, AST 14, BUN 20, creatinine 0.77, potassium 4.7, sodium 145, GFR >60 A1c 6.1% Total cholesterol 195, HDL 52, LDL 122, triglycerides 57  12/30/2020: Glucose 104, BUN/Cr 17/0.69. EGFR 96. Na/K 143/4.4.   10/2020: Glucose 99, BUN/Cr 17/0.77. EGFR 81. Na/K 146/4. Rest of the CMP normal H/H 38.7/12.8. MCV 86.8. Platelets 207 HbA1C 6.0 % Chol 170, TG 49, HDL 65, LDL 95 TSH 2.7 normal  10/19/2018: Glucose 103. BUN/Cr 18/0.7. eGFR 86. Na/K ?/4.Marland Kitchen H/H 13/39. MCV 86. Platelets 266. Chol 15, TG 72, HDL 35, LDL 88   Review of Systems  Cardiovascular:  Negative for chest pain, claudication, dyspnea on exertion, leg swelling, near-syncope, orthopnea, palpitations, paroxysmal nocturnal dyspnea and syncope.  Neurological:  Negative for dizziness.        Vitals:   01/07/22 1535  BP: (!) 152/82  Pulse: 78  Resp: 17  Temp: 98.4 F (36.9 C)  SpO2: 98%    Body mass index is 32.28 kg/m. Filed Weights   01/07/22 1535  Weight: 194 lb (88 kg)    Objective:  Physical Exam Vitals and nursing note reviewed.  Constitutional:      General: She is not in acute distress. Neck:     Vascular: No JVD.  Cardiovascular:     Rate and Rhythm: Normal rate and regular rhythm.     Pulses: Normal pulses.     Heart sounds: Normal heart sounds. No murmur heard. Pulmonary:     Effort: Pulmonary effort is normal.     Breath sounds: Normal breath sounds. No wheezing or rales.  Chest:     Chest wall: No tenderness.  Musculoskeletal:     Right lower leg: No edema.     Left lower leg: No edema.   Physical exam  unchanged compared to previous office visit.     Assessment & Recommendations:   66 y.o. Caucasian female with hypertension, hyperlipidemia, [name], mild obesity, with chest pain.  Cardiac monitor in 04/2021 revealed asymptomatic PACs/PVCs as well as SVT.  Hypertension/Atypical chest pain: Exercise nuclear stress test (10/2020) with hypertensive response, borderline EKG changes, but no SPECT evidence of ischemia/infarct.  Suspect hypertension during testing caused supply/demand ischemia. Hypertension is uncontrolled, will increase losartan from 50 mg to 100 mg p.o. daily.  Repeat BMP in 1 week. Continue metoprolol Patient was unable to tolerate amlodipine due to leg edema.  Palpitations: Previous cardiac monitor revealed no significant cardiac arrhythmias, patient symptoms correlated with normal sinus rhythm and PACs/PVCs. Palpitations are well controlled with current beta-blocker therapy, continue metoprolol  Mixed hyperlipidemia: Currently on Lipitor to 40 mg daily per endocrinology.  I personally reviewed external labs, lipids were uncontrolled.  Will defer repeat lipid profile testing to endocrine.  Follow-up in 6 months, sooner if needed.   Alethia Berthold, PA-C 01/08/2022,  9:21 AM Office: [phone]

## 2022-01-15 LAB — BASIC METABOLIC PANEL
BUN/Creatinine Ratio: 27 (ref 12–28)
BUN: 21 mg/dL (ref 8–27)
CO2: 22 mmol/L (ref 20–29)
Calcium: 9.4 mg/dL (ref 8.7–10.3)
Chloride: 105 mmol/L (ref 96–106)
Creatinine, Ser: 0.78 mg/dL (ref 0.57–1.00)
Glucose: 92 mg/dL (ref 70–99)
Potassium: 4.1 mmol/L (ref 3.5–5.2)
Sodium: 142 mmol/L (ref 134–144)
eGFR: 84 mL/min/{1.73_m2} (ref >59)

## 2022-03-24 ENCOUNTER — Ambulatory Visit: Payer: 59 | Admitting: Podiatry

## 2022-03-24 ENCOUNTER — Encounter: Payer: Self-pay | Admitting: Podiatry

## 2022-03-24 ENCOUNTER — Ambulatory Visit (INDEPENDENT_AMBULATORY_CARE_PROVIDER_SITE_OTHER): Payer: 59

## 2022-03-24 DIAGNOSIS — M79671 Pain in right foot: Secondary | ICD-10-CM

## 2022-03-24 DIAGNOSIS — M79672 Pain in left foot: Secondary | ICD-10-CM | POA: Diagnosis not present

## 2022-03-24 DIAGNOSIS — M778 Other enthesopathies, not elsewhere classified: Secondary | ICD-10-CM

## 2022-03-24 DIAGNOSIS — M779 Enthesopathy, unspecified: Secondary | ICD-10-CM

## 2022-03-24 DIAGNOSIS — E1149 Type 2 diabetes mellitus with other diabetic neurological complication: Secondary | ICD-10-CM

## 2022-03-24 DIAGNOSIS — E1140 Type 2 diabetes mellitus with diabetic neuropathy, unspecified: Secondary | ICD-10-CM | POA: Diagnosis not present

## 2022-03-24 MED ORDER — TRIAMCINOLONE ACETONIDE 10 MG/ML IJ SUSP
10.0000 mg | Freq: Once | INTRAMUSCULAR | Status: AC
  Administered 2022-03-24: 10 mg

## 2022-03-24 NOTE — Progress Notes (Signed)
Dg  

## 2022-03-26 ENCOUNTER — Other Ambulatory Visit: Payer: Self-pay | Admitting: Podiatry

## 2022-03-26 DIAGNOSIS — M779 Enthesopathy, unspecified: Secondary | ICD-10-CM

## 2022-04-20 NOTE — Progress Notes (Signed)
Subjective:   Patient ID: [name], female   DOB: 66 y.o.   MRN: [id number]   HPI Patient presents with pain in her right foot lateral side that is localized with no other history of problems noted.  States its been present for a while and has not sure what may have happened that led to this.  Patient does not smoke likes to be active   Review of Systems  All other systems reviewed and are negative.       Objective:  Physical Exam Vitals and nursing note reviewed.  Constitutional:      Appearance: She is well-developed.  Pulmonary:     Effort: Pulmonary effort is normal.  Musculoskeletal:        General: Normal range of motion.  Skin:    General: Skin is warm.  Neurological:     Mental Status: She is alert.     Neurovascular status intact muscle strength found to be adequate range of motion within normal limits.  Patient is found to have lateral inflammation of the right foot on the dorsal surface and into the extensor complex and affecting the midfoot area.  Patient is found to have good digital perfusion well-oriented     Assessment:  Extensor tendinitis right with inflammation fluid buildup     Plan:  H&P x-rays reviewed condition discussed sterile prep did inject the extensor complex after explaining risk to patient tear or other pathology carefully with 3 mg Dexasone Kenalog 5 mg Xylocaine advised on ice therapy reduced activity and topical anti-inflammatory as needed.  Reappoint as symptoms indicate  X-rays indicate slight changes around the midtarsal joint right not significant no other pathology noted

## 2022-06-03 ENCOUNTER — Other Ambulatory Visit (HOSPITAL_BASED_OUTPATIENT_CLINIC_OR_DEPARTMENT_OTHER): Payer: Self-pay

## 2022-06-03 MED ORDER — TRULICITY 3 MG/0.5ML ~~LOC~~ SOAJ
3.0000 mg | SUBCUTANEOUS | 5 refills | Status: DC
  Filled 2022-06-03 – 2022-06-22 (×2): qty 2, 28d supply, fill #0
  Filled 2022-07-30: qty 2, 28d supply, fill #1
  Filled 2022-08-25: qty 2, 28d supply, fill #2
  Filled 2022-09-20: qty 2, 28d supply, fill #3
  Filled 2022-10-29: qty 2, 28d supply, fill #4
  Filled 2022-11-23: qty 2, 28d supply, fill #5

## 2022-06-15 ENCOUNTER — Other Ambulatory Visit (HOSPITAL_BASED_OUTPATIENT_CLINIC_OR_DEPARTMENT_OTHER): Payer: Self-pay

## 2022-06-22 ENCOUNTER — Other Ambulatory Visit (HOSPITAL_BASED_OUTPATIENT_CLINIC_OR_DEPARTMENT_OTHER): Payer: Self-pay

## 2022-07-15 ENCOUNTER — Ambulatory Visit: Payer: 59 | Admitting: Cardiology

## 2022-07-23 ENCOUNTER — Encounter: Payer: Self-pay | Admitting: Cardiology

## 2022-07-23 ENCOUNTER — Ambulatory Visit: Payer: 59 | Admitting: Cardiology

## 2022-07-23 VITALS — BP 150/69 | HR 69 | Ht 65.0 in | Wt 194.0 lb

## 2022-07-23 DIAGNOSIS — E782 Mixed hyperlipidemia: Secondary | ICD-10-CM

## 2022-07-23 DIAGNOSIS — I10 Essential (primary) hypertension: Secondary | ICD-10-CM

## 2022-07-23 NOTE — Progress Notes (Signed)
Patient referred by [name], MD for chest tightness  Subjective:   [name], female    DOB: December 19, 1955, 67 y.o.   MRN: [id number]   Chief Complaint  Patient presents with   Essential hypertension   Mixed hyperlipidemia   Follow-up    HPI  67 y.o. Caucasian female with hypertension, hyperlipidemia, [name], mild obesity, with chest pain.  No exertional chest pain or significant dyspnea. BP had improved after adding chlorthalidone, but elevated today.   Current Outpatient Medications:    albuterol (VENTOLIN HFA) 108 (90 Base) MCG/ACT inhaler, 1 PUFF AS NEEDED EVERY 6 HRS INHALATION 30 DAYS, Disp: , Rfl:    atorvastatin (LIPITOR) 40 MG tablet, Take 20 mg by mouth daily., Disp: , Rfl:    Cholecalciferol (VITAMIN D3) 50 MCG (2000 UT) CHEW, Chew by mouth 2 (two) times a day., Disp: , Rfl:    Dulaglutide (TRULICITY) 3 LK/4.4WN SOPN, Inject 3 mg into the skin once a week., Disp: 2 mL, Rfl: 5   HYDROcodone-acetaminophen (NORCO/VICODIN) 5-325 MG tablet, Take 1 tablet by mouth as needed., Disp: , Rfl:    ibuprofen (ADVIL) 600 MG tablet, Take 1 tablet by mouth as needed., Disp: , Rfl:    LORazepam (ATIVAN) 0.5 MG tablet, Take 0.5 mg by mouth as needed for anxiety., Disp: , Rfl:    losartan (COZAAR) 100 MG tablet, Take 1 tablet (100 mg total) by mouth daily. (Patient taking differently: Take 50 mg by mouth daily.), Disp: 90 tablet, Rfl: 3   metFORMIN (GLUCOPHAGE-XR) 750 MG 24 hr tablet, Take 750 mg by mouth as needed., Disp: , Rfl:    metoprolol succinate (TOPROL-XL) 50 MG 24 hr tablet, Take 50 mg by mouth daily. Take with or immediately following a meal., Disp: , Rfl:    Potassium Citrate 15 MEQ (1620 MG) TBCR, Take by mouth., Disp: , Rfl:    tamsulosin (FLOMAX) 0.4 MG CAPS capsule, Take 1 capsule by mouth as needed., Disp: , Rfl:    hydrochlorothiazide (MICROZIDE) 12.5 MG capsule, Take 12.5 mg by mouth daily., Disp: , Rfl:   Cardiovascular studies:  EKG  07/23/2022: Sinus rhythm 62 bpm  Diffuse nonspecific T-abnormality  Mobile cardiac telemetry 11 days 05/08/2021 - 05/20/2021: Dominant rhythm: Sinus. HR 56-137 bpm. Avg HR 82 bpm. 13 episodes of SVT, fastest at 193 bpm for 7.5 secs, longest for 12.8 secs at 160 bpm., without any reported symptoms. <1% isolated SVE, couplet/triplets. 0 episodes of VT <1% isolated VE, couplets. No triplets. No atrial fibrillation/atrial flutter/VT/high grade AV block, sinus pause >3sec noted. 114 patient triggered events, mostly while in sinus rhythm.   Exercise Sestamibi stress test 11/24/2020: Exercise nuclear stress test was performed using Bruce protocol. Patient reached 7 METS, and 83% of age predicted maximum heart rate. Exercise capacity was fair. No chest pain reported. Normal heart rate and hypertensive blood pressure response (peak BP 224/88 mmHg). Peak EKG/ECG demonstrated normal sinus tachycardia, <1 mm ST depression in leads II, III, aVF, V4-V6, which is negative for ischemia. Normal myocardial perfusion. Stress LVEF 73%. Low risk study.  Echocardiogram 01/04/2019: Normal LV systolic function with EF 65%. Left ventricle cavity is normal in size. Mild concentric hypertrophy of the left ventricle. Normal global wall motion. Doppler evidence of grade I (impaired) diastolic dysfunction, normal LAP.  Left atrial cavity is mildly dilated. Mild (Grade I) mitral regurgitation. Normal right atrial pressure.    Recent labs: 01/14/2022: Glucose 92, BUN/Cr 21/0.78. EGFR 84. Na/K 142/4.1.   12/30/2020:  Glucose 104, BUN/Cr 17/0.69. EGFR 96. Na/K 143/4.4.   10/2020: Glucose 99, BUN/Cr 17/0.77. EGFR 81. Na/K 146/4. Rest of the CMP normal H/H 38.7/12.8. MCV 86.8. Platelets 207 HbA1C 6.0 % Chol 170, TG 49, HDL 65, LDL 95 TSH 2.7 normal  10/19/2018: Glucose 103. BUN/Cr 18/0.7. eGFR 86. Na/K ?/4.Marland Kitchen H/H 13/39. MCV 86. Platelets 266. Chol 15, TG 72, HDL 35, LDL 88   Review of Systems  Cardiovascular:   Negative for chest pain, dyspnea on exertion, leg swelling, palpitations and syncope.        Vitals:   07/23/22 1200  BP: (!) 150/69  Pulse: 69  SpO2: 94%    Body mass index is 32.28 kg/m. Filed Weights   07/23/22 1200  Weight: 194 lb (88 kg)     Objective:  Physical Exam Vitals and nursing note reviewed.  Constitutional:      General: She is not in acute distress. Neck:     Vascular: No JVD.  Cardiovascular:     Rate and Rhythm: Normal rate and regular rhythm.     Pulses: Normal pulses.     Heart sounds: Normal heart sounds. No murmur heard. Pulmonary:     Effort: Pulmonary effort is normal.     Breath sounds: Normal breath sounds. No wheezing or rales.  Chest:     Chest wall: No tenderness.  Musculoskeletal:     Right lower leg: No edema.     Left lower leg: No edema.          Assessment & Recommendations:   67 y.o. Caucasian female with hypertension, hyperlipidemia, [name], mild obesity, with chest pain.  Atypical chest pain: Exercise nuclear stress test (10/2020) with hypertensive response, borderline EKG changes, but no SPECT evidence of ischemia/infarct. Suspected to be due to suboptimal control of hypertension causing demand ischemia.  Hypertension: Recommend regular home checks.  If SBP remains >140 mmHg, recommend increasing losartan to 100 mg daily. Will then check BMP. Continue chlorthalidone 25 mg daily, metoprolol succinate 50 mg daily.  Mixed hyperlipidemia: Currently on Lipitor to 20 mg daily.    F/u in 4 weeks     Nigel Mormon, MD Pager: [phone] Office: [phone]

## 2022-07-30 ENCOUNTER — Other Ambulatory Visit (HOSPITAL_BASED_OUTPATIENT_CLINIC_OR_DEPARTMENT_OTHER): Payer: Self-pay

## 2022-08-02 ENCOUNTER — Other Ambulatory Visit (HOSPITAL_COMMUNITY): Payer: Self-pay

## 2022-08-20 ENCOUNTER — Other Ambulatory Visit (HOSPITAL_COMMUNITY): Payer: Self-pay | Admitting: Cardiology

## 2022-08-21 LAB — BASIC METABOLIC PANEL
BUN/Creatinine Ratio: 26 (ref 12–28)
BUN: 19 mg/dL (ref 8–27)
CO2: 25 mmol/L (ref 20–29)
Calcium: 9.3 mg/dL (ref 8.7–10.3)
Chloride: 104 mmol/L (ref 96–106)
Creatinine, Ser: 0.73 mg/dL (ref 0.57–1.00)
Glucose: 118 mg/dL — ABNORMAL HIGH (ref 70–99)
Potassium: 3.9 mmol/L (ref 3.5–5.2)
Sodium: 145 mmol/L — ABNORMAL HIGH (ref 134–144)
eGFR: 91 mL/min/{1.73_m2} (ref >59)

## 2022-08-25 ENCOUNTER — Encounter: Payer: Self-pay | Admitting: Cardiology

## 2022-08-25 ENCOUNTER — Ambulatory Visit: Payer: 59 | Admitting: Cardiology

## 2022-08-25 VITALS — BP 135/75 | HR 68 | Ht 65.0 in | Wt 196.6 lb

## 2022-08-25 DIAGNOSIS — E782 Mixed hyperlipidemia: Secondary | ICD-10-CM

## 2022-08-25 DIAGNOSIS — I10 Essential (primary) hypertension: Secondary | ICD-10-CM

## 2022-08-25 MED ORDER — LOSARTAN POTASSIUM-HCTZ 50-12.5 MG PO TABS: 1.0000 | ORAL_TABLET | Freq: Every day | ORAL | 3 refills | Status: DC

## 2022-08-25 NOTE — Progress Notes (Signed)
Patient referred by Katherine Seashore, MD for chest tightness  Subjective:   [name], female    DOB: 1955-11-03, 67 y.o.   MRN: [id number]   Chief Complaint  Patient presents with   Essential hypertension   Follow-up    HPI  67 y.o. Caucasian female with hypertension, hyperlipidemia, [name], mild obesity, with chest pain.  Patient is currently still taking losartan 50 mg daily, instead of 100 mg daily, as her blood pressure improved after addition of hydrochlorothiazide alone.  She is feeling well and has no complaints today.   Current Outpatient Medications:    albuterol (VENTOLIN HFA) 108 (90 Base) MCG/ACT inhaler, 1 PUFF AS NEEDED EVERY 6 HRS INHALATION 30 DAYS, Disp: , Rfl:    atorvastatin (LIPITOR) 40 MG tablet, Take 20 mg by mouth daily., Disp: , Rfl:    Cholecalciferol (VITAMIN D3) 50 MCG (2000 UT) CHEW, Chew by mouth 2 (two) times a day., Disp: , Rfl:    Dulaglutide (TRULICITY) 3 0000000 SOPN, Inject 3 mg into the skin once a week., Disp: 2 mL, Rfl: 5   hydrochlorothiazide (MICROZIDE) 12.5 MG capsule, Take 12.5 mg by mouth daily., Disp: , Rfl:    HYDROcodone-acetaminophen (NORCO/VICODIN) 5-325 MG tablet, Take 1 tablet by mouth as needed., Disp: , Rfl:    ibuprofen (ADVIL) 600 MG tablet, Take 1 tablet by mouth as needed., Disp: , Rfl:    LORazepam (ATIVAN) 0.5 MG tablet, Take 0.5 mg by mouth as needed for anxiety., Disp: , Rfl:    losartan (COZAAR) 100 MG tablet, Take 1 tablet (100 mg total) by mouth daily. (Patient taking differently: Take 50 mg by mouth daily.), Disp: 90 tablet, Rfl: 3   metFORMIN (GLUCOPHAGE-XR) 750 MG 24 hr tablet, Take 750 mg by mouth as needed., Disp: , Rfl:    metoprolol succinate (TOPROL-XL) 50 MG 24 hr tablet, Take 50 mg by mouth daily. Take with or immediately following a meal., Disp: , Rfl:    Potassium Citrate 15 MEQ (1620 MG) TBCR, Take by mouth., Disp: , Rfl:    tamsulosin (FLOMAX) 0.4 MG CAPS capsule, Take 1 capsule by  mouth as needed., Disp: , Rfl:   Cardiovascular studies:  EKG 07/23/2022: Sinus rhythm 62 bpm  Diffuse nonspecific T-abnormality  Mobile cardiac telemetry 11 days 05/08/2021 - 05/20/2021: Dominant rhythm: Sinus. HR 56-137 bpm. Avg HR 82 bpm. 13 episodes of SVT, fastest at 193 bpm for 7.5 secs, longest for 12.8 secs at 160 bpm., without any reported symptoms. <1% isolated SVE, couplet/triplets. 0 episodes of VT <1% isolated VE, couplets. No triplets. No atrial fibrillation/atrial flutter/VT/high grade AV block, sinus pause >3sec noted. 114 patient triggered events, mostly while in sinus rhythm.   Exercise Sestamibi stress test 11/24/2020: Exercise nuclear stress test was performed using Bruce protocol. Patient reached 7 METS, and 83% of age predicted maximum heart rate. Exercise capacity was fair. No chest pain reported. Normal heart rate and hypertensive blood pressure response (peak BP 224/88 mmHg). Peak EKG/ECG demonstrated normal sinus tachycardia, <1 mm ST depression in leads II, III, aVF, V4-V6, which is negative for ischemia. Normal myocardial perfusion. Stress LVEF 73%. Low risk study.  Echocardiogram 01/04/2019: Normal LV systolic function with EF 65%. Left ventricle cavity is normal in size. Mild concentric hypertrophy of the left ventricle. Normal global wall motion. Doppler evidence of grade I (impaired) diastolic dysfunction, normal LAP.  Left atrial cavity is mildly dilated. Mild (Grade I) mitral regurgitation. Normal right atrial pressure.  Recent labs: 08/20/2022: Glucose 118, BUN/Cr 19/0.73. EGFR 91. Na/K 145/3.9.   01/14/2022: Glucose 92, BUN/Cr 21/0.78. EGFR 84. Na/K 142/4.1.   12/30/2020: Glucose 104, BUN/Cr 17/0.69. EGFR 96. Na/K 143/4.4.   10/2020: Glucose 99, BUN/Cr 17/0.77. EGFR 81. Na/K 146/4. Rest of the CMP normal H/H 38.7/12.8. MCV 86.8. Platelets 207 HbA1C 6.0 % Chol 170, TG 49, HDL 65, LDL 95 TSH 2.7 normal  10/19/2018: Glucose 103. BUN/Cr 18/0.7.  eGFR 86. Na/K ?/4.Marland Kitchen H/H 13/39. MCV 86. Platelets 266. Chol 15, TG 72, HDL 35, LDL 88   Review of Systems  Cardiovascular:  Negative for chest pain, dyspnea on exertion, leg swelling, palpitations and syncope.        Vitals:   08/25/22 1054  BP: 135/75  Pulse: 68  SpO2: 95%    Body mass index is 32.72 kg/m. Filed Weights   08/25/22 1054  Weight: 196 lb 9.6 oz (89.2 kg)     Objective:  Physical Exam Vitals and nursing note reviewed.  Constitutional:      General: She is not in acute distress. Neck:     Vascular: No JVD.  Cardiovascular:     Rate and Rhythm: Normal rate and regular rhythm.     Pulses: Normal pulses.     Heart sounds: Normal heart sounds. No murmur heard. Pulmonary:     Effort: Pulmonary effort is normal.     Breath sounds: Normal breath sounds. No wheezing or rales.  Chest:     Chest wall: No tenderness.  Musculoskeletal:     Right lower leg: No edema.     Left lower leg: No edema.          Assessment & Recommendations:   67 y.o. Caucasian female with hypertension, hyperlipidemia, [name], mild obesity, with chest pain.  Atypical chest pain: Exercise nuclear stress test (10/2020) with hypertensive response, borderline EKG changes, but no SPECT evidence of ischemia/infarct. Suspected to be due to suboptimal control of hypertension causing demand ischemia.  Hypertension: Well-controlled.   Will change losartan 50 mg, and hydrochlorothiazide 12.5 mg daily separately  to losartan-hydrochlorothiazide 50-12.5 mg daily.    Mixed hyperlipidemia: Currently on Lipitor to 20 mg daily.    F/u in 6 months    Katherine Mormon, MD Pager: [phone] Office: [phone]

## 2022-12-21 ENCOUNTER — Other Ambulatory Visit (HOSPITAL_BASED_OUTPATIENT_CLINIC_OR_DEPARTMENT_OTHER): Payer: Self-pay

## 2022-12-22 ENCOUNTER — Other Ambulatory Visit (HOSPITAL_BASED_OUTPATIENT_CLINIC_OR_DEPARTMENT_OTHER): Payer: Self-pay

## 2022-12-22 MED ORDER — TRULICITY 3 MG/0.5ML ~~LOC~~ SOAJ
3.0000 mg | SUBCUTANEOUS | 0 refills | Status: DC
  Filled 2022-12-22: qty 2, 28d supply, fill #0

## 2023-01-11 ENCOUNTER — Ambulatory Visit: Payer: 59 | Admitting: Cardiology

## 2023-01-11 ENCOUNTER — Encounter: Payer: Self-pay | Admitting: Cardiology

## 2023-01-11 VITALS — BP 143/81 | HR 85 | Resp 16 | Ht 65.0 in | Wt 199.0 lb

## 2023-01-11 DIAGNOSIS — R072 Precordial pain: Secondary | ICD-10-CM

## 2023-01-11 DIAGNOSIS — I10 Essential (primary) hypertension: Secondary | ICD-10-CM

## 2023-01-11 DIAGNOSIS — E782 Mixed hyperlipidemia: Secondary | ICD-10-CM

## 2023-01-11 DIAGNOSIS — R9431 Abnormal electrocardiogram [ECG] [EKG]: Secondary | ICD-10-CM

## 2023-01-11 LAB — TROPONIN T: Troponin T (Highly Sensitive): 12 ng/L (ref 0–14)

## 2023-01-11 MED ORDER — NITROGLYCERIN 0.4 MG SL SUBL: 0.4000 mg | SUBLINGUAL_TABLET | SUBLINGUAL | 3 refills | Status: DC | PRN

## 2023-01-11 MED ORDER — DILTIAZEM HCL ER COATED BEADS 120 MG PO CP24: 120.0000 mg | ORAL_CAPSULE | Freq: Every day | ORAL | 3 refills | Status: DC

## 2023-01-11 NOTE — Patient Instructions (Signed)
Your cardiac CT will be scheduled at the locations below:   [facility]  [address]  [place], [place] [address]  [phone]    If scheduled at [facility], please arrive at the [facility] main entrance of [facility] 30-45 minutes prior to test start time.  Proceed to the [facility] Radiology Department (first floor) to check-in and test prep.   Please follow these instructions carefully (unless otherwise directed):    On the Night Before the Test:   Be sure to Drink plenty of water.   Do not consume any caffeinated/decaffeinated beverages or chocolate 12 hours prior to your test.   Do not take any antihistamines 12 hours prior to your test.   If the patient has contrast allergy:  Patient will need a prescription for Prednisone and very clear instructions (as follows):  1. Prednisone 50 mg - take 13 hours prior to test  2. Take another Prednisone 50 mg 7 hours prior to test  3. Take another Prednisone 50 mg 1 hour prior to test  4. Take Benadryl 50 mg 1 hour prior to test   Patient must complete all four doses of above prophylactic medications.   Patient will need a ride after test due to Benadryl.   On the Day of the Test:   Drink plenty of water. Do not drink any water within one hour of the test.   Do not eat any food 4 hours prior to the test.   You may take your regular medications prior to the test.   Take your metoprolol and diltiazem 2 hours before CT scan You may stop it after the CT scan, unless specified otherwise by me.    FEMALES- please wear underwire-free bra if available          After the Test:   Drink plenty of water.   After receiving IV contrast, you may experience a mild flushed feeling. This is normal.   On occasion, you may experience a mild rash up to 24 hours after the test. This is not dangerous. If this occurs, you can take Benadryl 25 mg and increase your fluid intake.   If you experience trouble  breathing, this can be serious. If it is severe call 911 IMMEDIATELY. If it is mild, please call our office.   If you take any of these medications: Glipizide/Metformin, Avandament, Glucavance, please do not take 48 hours after completing test unless otherwise instructed.     Please contact the cardiac imaging nurse navigator should you have any questions/concerns  Rockwell Alexandria, RN Navigator Cardiac Imaging  [facility]  [phone] Office  [phone] Cell

## 2023-01-11 NOTE — Progress Notes (Signed)
Patient referred by Georgianne Fick, MD for chest tightness  Subjective:   [name], female    DOB: 08/11/1955, 67 y.o.   MRN: [id number]   Chief Complaint  Patient presents with   Follow-up   Abnormal ECG    HPI  67 y.o. Caucasian female with hypertension, hyperlipidemia, [name], mild obesity, with chest pain.  Patient has had complaints of retrosternal burning, burping sensation for last 4-5 days. These symptoms are unrelated to physical activity. These are somewhat improved with Pepcid. Blood pressure is slightly elevated today.     Current Outpatient Medications:    albuterol (VENTOLIN HFA) 108 (90 Base) MCG/ACT inhaler, 1 PUFF AS NEEDED EVERY 6 HRS INHALATION 30 DAYS, Disp: , Rfl:    Aspirin (VAZALORE) 81 MG CAPS, Take 1 tablet by mouth daily at 2 PM., Disp: , Rfl:    atorvastatin (LIPITOR) 40 MG tablet, Take 20 mg by mouth daily., Disp: , Rfl:    Cholecalciferol (VITAMIN D3) 50 MCG (2000 UT) CHEW, Chew by mouth 2 (two) times a day., Disp: , Rfl:    Dulaglutide (TRULICITY) 3 MG/0.5ML SOPN, Inject 3 mg into the skin once a week., Disp: 2 mL, Rfl: 0   HYDROcodone-acetaminophen (NORCO/VICODIN) 5-325 MG tablet, Take 1 tablet by mouth as needed., Disp: , Rfl:    ibuprofen (ADVIL) 600 MG tablet, Take 1 tablet by mouth as needed., Disp: , Rfl:    LORazepam (ATIVAN) 0.5 MG tablet, Take 0.5 mg by mouth as needed for anxiety., Disp: , Rfl:    losartan-hydrochlorothiazide (HYZAAR) 50-12.5 MG tablet, Take 1 tablet by mouth daily., Disp: 90 tablet, Rfl: 3   metFORMIN (GLUCOPHAGE-XR) 750 MG 24 hr tablet, Take 750 mg by mouth as needed., Disp: , Rfl:    metoprolol succinate (TOPROL-XL) 50 MG 24 hr tablet, Take 50 mg by mouth daily. Take with or immediately following a meal., Disp: , Rfl:    tamsulosin (FLOMAX) 0.4 MG CAPS capsule, Take 1 capsule by mouth as needed., Disp: , Rfl:   Cardiovascular studies:  EKG 01/11/2023: Sinus rhythm 81 bpm RSR(V1)  -nondiagnostic. Nonspecific T-abnormality   Mobile cardiac telemetry 11 days 05/08/2021 - 05/20/2021: Dominant rhythm: Sinus. HR 56-137 bpm. Avg HR 82 bpm. 13 episodes of SVT, fastest at 193 bpm for 7.5 secs, longest for 12.8 secs at 160 bpm., without any reported symptoms. <1% isolated SVE, couplet/triplets. 0 episodes of VT <1% isolated VE, couplets. No triplets. No atrial fibrillation/atrial flutter/VT/high grade AV block, sinus pause >3sec noted. 114 patient triggered events, mostly while in sinus rhythm.   Exercise Sestamibi stress test 11/24/2020: Exercise nuclear stress test was performed using Bruce protocol. Patient reached 7 METS, and 83% of age predicted maximum heart rate. Exercise capacity was fair. No chest pain reported. Normal heart rate and hypertensive blood pressure response (peak BP 224/88 mmHg). Peak EKG/ECG demonstrated normal sinus tachycardia, <1 mm ST depression in leads II, III, aVF, V4-V6, which is negative for ischemia. Normal myocardial perfusion. Stress LVEF 73%. Low risk study.  Echocardiogram 01/04/2019: Normal LV systolic function with EF 65%. Left ventricle cavity is normal in size. Mild concentric hypertrophy of the left ventricle. Normal global wall motion. Doppler evidence of grade I (impaired) diastolic dysfunction, normal LAP.  Left atrial cavity is mildly dilated. Mild (Grade I) mitral regurgitation. Normal right atrial pressure.    Recent labs: 08/20/2022: Glucose 118, BUN/Cr 19/0.73. EGFR 91. Na/K 145/3.9.   01/14/2022: Glucose 92, BUN/Cr 21/0.78. EGFR 84. Na/K 142/4.1.  12/30/2020: Glucose 104, BUN/Cr 17/0.69. EGFR 96. Na/K 143/4.4.   10/2020: Glucose 99, BUN/Cr 17/0.77. EGFR 81. Na/K 146/4. Rest of the CMP normal H/H 38.7/12.8. MCV 86.8. Platelets 207 HbA1C 6.0 % Chol 170, TG 49, HDL 65, LDL 95 TSH 2.7 normal  10/19/2018: Glucose 103. BUN/Cr 18/0.7. eGFR 86. Na/K ?/4.Marland Kitchen H/H 13/39. MCV 86. Platelets 266. Chol 15, TG 72, HDL 35, LDL  88   Review of Systems  Cardiovascular:  Positive for chest pain. Negative for dyspnea on exertion, leg swelling, palpitations and syncope.        Vitals:   01/11/23 1409  BP: (!) 143/81  Pulse: 85  Resp: 16  SpO2: 94%     Body mass index is 33.12 kg/m. Filed Weights   01/11/23 1409  Weight: 199 lb (90.3 kg)      Objective:  Physical Exam Vitals and nursing note reviewed.  Constitutional:      General: She is not in acute distress. Neck:     Vascular: No JVD.  Cardiovascular:     Rate and Rhythm: Normal rate and regular rhythm.     Pulses: Normal pulses.     Heart sounds: Normal heart sounds. No murmur heard. Pulmonary:     Effort: Pulmonary effort is normal.     Breath sounds: Normal breath sounds. No wheezing or rales.  Chest:     Chest wall: No tenderness.  Musculoskeletal:     Right lower leg: No edema.     Left lower leg: No edema.          Assessment & Recommendations:   67 y.o. Caucasian female with hypertension, hyperlipidemia, [name], mild obesity, with chest pain.  Precordial burning sensation: Not typical for angina, but cannot exclude cardiac etiology. EKG with nonspecific ST-T abnormality.  Will check stat troponin. If trop elevated, recommend urgent visit to ER with plans for coronary angiography tomorrow.  Previous nuclear stress tests (last in 2022) with normal perfusion imaging.  Will check coronary CTA. In the meantime, added diltiazem 120 mg daily for rate control as well as angina. Continue Aspirin, statin, metoprolol, losartan-hydrochlorothiazide.  F/u in 4 weeks    Elder Negus, MD Pager: [phone] Office: [phone]

## 2023-01-26 ENCOUNTER — Other Ambulatory Visit (HOSPITAL_BASED_OUTPATIENT_CLINIC_OR_DEPARTMENT_OTHER): Payer: Self-pay

## 2023-01-26 NOTE — Addendum Note (Signed)
Addended by: Elder Negus on: 01/26/2023 01:06 PM   Modules accepted: Orders

## 2023-02-01 ENCOUNTER — Telehealth (HOSPITAL_COMMUNITY): Payer: Self-pay | Admitting: Emergency Medicine

## 2023-02-01 ENCOUNTER — Other Ambulatory Visit (HOSPITAL_BASED_OUTPATIENT_CLINIC_OR_DEPARTMENT_OTHER): Payer: Self-pay

## 2023-02-01 MED ORDER — TRULICITY 3 MG/0.5ML ~~LOC~~ SOAJ
3.0000 mg | SUBCUTANEOUS | 3 refills | Status: DC
  Filled 2023-02-01: qty 2, 28d supply, fill #0

## 2023-02-01 NOTE — Telephone Encounter (Signed)
Reaching out to patient to offer assistance regarding upcoming cardiac imaging study; pt verbalizes understanding of appt date/time, parking situation and where to check in, pre-test NPO status and medications ordered, and verified current allergies; name and call back number provided for further questions should they arise Sara Wallace RN Navigator Cardiac Imaging [facility] [phone] office [phone] cell 

## 2023-02-01 NOTE — Telephone Encounter (Signed)
Attempted to call patient regarding upcoming cardiac CT appointment. °Left message on voicemail with name and callback number °Sara Wallace RN Navigator Cardiac Imaging °[facility] °[phone] Office °[phone] Cell ° °

## 2023-02-02 ENCOUNTER — Ambulatory Visit (HOSPITAL_COMMUNITY): Payer: 59

## 2023-02-02 ENCOUNTER — Ambulatory Visit (HOSPITAL_COMMUNITY)
Admission: RE | Admit: 2023-02-02 | Discharge: 2023-02-02 | Disposition: A | Discharge status: Home / Self Care | Payer: 59 | Source: Ambulatory Visit | Attending: Cardiology | Admitting: Cardiology

## 2023-02-02 DIAGNOSIS — R9431 Abnormal electrocardiogram [ECG] [EKG]: Secondary | ICD-10-CM | POA: Diagnosis present

## 2023-02-02 MED ORDER — NITROGLYCERIN 0.4 MG SL SUBL
0.8000 mg | SUBLINGUAL_TABLET | Freq: Once | SUBLINGUAL | Status: AC
  Administered 2023-02-02: 0.8 mg via SUBLINGUAL

## 2023-02-02 MED ORDER — IOHEXOL 350 MG/ML SOLN
95.0000 mL | Freq: Once | INTRAVENOUS | Status: AC | PRN
  Administered 2023-02-02: 95 mL via INTRAVENOUS

## 2023-02-02 MED ORDER — METOPROLOL TARTRATE 5 MG/5ML IV SOLN
INTRAVENOUS | Status: AC
  Filled 2023-02-02: qty 10

## 2023-02-02 MED ORDER — NITROGLYCERIN 0.4 MG SL SUBL
SUBLINGUAL_TABLET | SUBLINGUAL | Status: AC
  Filled 2023-02-02: qty 2

## 2023-02-02 MED ORDER — METOPROLOL TARTRATE 5 MG/5ML IV SOLN
10.0000 mg | Freq: Once | INTRAVENOUS | Status: AC
  Administered 2023-02-02: 10 mg via INTRAVENOUS

## 2023-02-04 ENCOUNTER — Other Ambulatory Visit: Payer: Self-pay

## 2023-02-04 ENCOUNTER — Other Ambulatory Visit (HOSPITAL_BASED_OUTPATIENT_CLINIC_OR_DEPARTMENT_OTHER): Payer: Self-pay

## 2023-02-04 MED ORDER — MOUNJARO 5 MG/0.5ML ~~LOC~~ SOAJ
5.0000 mg | SUBCUTANEOUS | 3 refills | Status: DC
  Filled 2023-02-04: qty 2, 28d supply, fill #0
  Filled 2023-02-23: qty 2, 28d supply, fill #1

## 2023-02-18 ENCOUNTER — Encounter (HOSPITAL_BASED_OUTPATIENT_CLINIC_OR_DEPARTMENT_OTHER): Payer: Self-pay

## 2023-02-18 ENCOUNTER — Other Ambulatory Visit (HOSPITAL_BASED_OUTPATIENT_CLINIC_OR_DEPARTMENT_OTHER): Payer: Self-pay

## 2023-02-23 ENCOUNTER — Other Ambulatory Visit (HOSPITAL_BASED_OUTPATIENT_CLINIC_OR_DEPARTMENT_OTHER): Payer: Self-pay

## 2023-02-25 ENCOUNTER — Ambulatory Visit: Payer: 59 | Admitting: Cardiology

## 2023-02-25 ENCOUNTER — Encounter: Payer: Self-pay | Admitting: Cardiology

## 2023-02-25 VITALS — BP 136/72 | HR 62 | Resp 16 | Ht 65.0 in | Wt 196.0 lb

## 2023-02-25 DIAGNOSIS — I10 Essential (primary) hypertension: Secondary | ICD-10-CM

## 2023-02-25 DIAGNOSIS — I700 Atherosclerosis of aorta: Secondary | ICD-10-CM | POA: Insufficient documentation

## 2023-02-25 DIAGNOSIS — E782 Mixed hyperlipidemia: Secondary | ICD-10-CM

## 2023-02-25 DIAGNOSIS — T466X5A Adverse effect of antihyperlipidemic and antiarteriosclerotic drugs, initial encounter: Secondary | ICD-10-CM | POA: Insufficient documentation

## 2023-02-25 NOTE — Progress Notes (Signed)
Patient referred by Georgianne Fick, MD for chest tightness  Subjective:   [name], female    DOB: 24-Mar-1956, 67 y.o.   MRN: [id number]   Chief Complaint  Patient presents with   Essential hypertension    HPI  67 y.o. Caucasian female with hypertension, hyperlipidemia, [name], mild obesity, with chest pain.  Patient is in no chest pain since last visit.  She has noticed lecturing with taking diltiazem.  She is unable to tolerate Lipitor greater than 20 mg due to myalgia.   Current Outpatient Medications:    albuterol (VENTOLIN HFA) 108 (90 Base) MCG/ACT inhaler, 1 PUFF AS NEEDED EVERY 6 HRS INHALATION 30 DAYS, Disp: , Rfl:    Aspirin (VAZALORE) 81 MG CAPS, Take 1 tablet by mouth daily at 2 PM., Disp: , Rfl:    atorvastatin (LIPITOR) 40 MG tablet, Take 20 mg by mouth daily., Disp: , Rfl:    Cholecalciferol (VITAMIN D3) 50 MCG (2000 UT) CHEW, Chew by mouth 2 (two) times a day., Disp: , Rfl:    diltiazem (CARDIZEM CD) 120 MG 24 hr capsule, Take 1 capsule (120 mg total) by mouth daily., Disp: 30 capsule, Rfl: 3   Dulaglutide (TRULICITY) 3 MG/0.5ML SOPN, Inject 3 mg into the skin once a week., Disp: 2 mL, Rfl: 3   HYDROcodone-acetaminophen (NORCO/VICODIN) 5-325 MG tablet, Take 1 tablet by mouth as needed., Disp: , Rfl:    ibuprofen (ADVIL) 600 MG tablet, Take 1 tablet by mouth as needed., Disp: , Rfl:    LORazepam (ATIVAN) 0.5 MG tablet, Take 0.5 mg by mouth as needed for anxiety., Disp: , Rfl:    losartan-hydrochlorothiazide (HYZAAR) 50-12.5 MG tablet, Take 1 tablet by mouth daily., Disp: 90 tablet, Rfl: 3   metFORMIN (GLUCOPHAGE-XR) 750 MG 24 hr tablet, Take 750 mg by mouth as needed., Disp: , Rfl:    metoprolol succinate (TOPROL-XL) 50 MG 24 hr tablet, Take 50 mg by mouth daily. Take with or immediately following a meal., Disp: , Rfl:    nitroGLYCERIN (NITROSTAT) 0.4 MG SL tablet, Place 1 tablet (0.4 mg total) under the tongue every 5 (five) minutes as  needed for chest pain., Disp: 30 tablet, Rfl: 3   tamsulosin (FLOMAX) 0.4 MG CAPS capsule, Take 1 capsule by mouth as needed., Disp: , Rfl:    tirzepatide (MOUNJARO) 5 MG/0.5ML Pen, Inject 5 mg into the skin every 7 (seven) days., Disp: 2 mL, Rfl: 3  Cardiovascular studies:  EKG 01/11/2023: Sinus rhythm 81 bpm RSR(V1) -nondiagnostic. Nonspecific T-abnormality  CCTA 02/02/2023: 1. Coronary calcium score of 38.6 AU. This was 67th percentile for age-, sex, and race-matched controls.  2. Total plaque volume (TPV) 186 mm3 which is 50th percentile for age- and sex-matched controls (calcified plaque 11 mm3; non-calcified plaque 175 mm3 of which 12 mm3 is low attenuation plaque). TPV of 186 mm3 is moderate.  3. Normal coronary origin with right dominance.  4. CAD-RADS = 2 Mild non-obstructive CAD (25-49%).  5.  Aortic atherosclerosis.   RECOMMENDATIONS:   Consider non-atherosclerotic causes of chest pain. Consider preventive therapy and risk factor modification.  Mobile cardiac telemetry 11 days 05/08/2021 - 05/20/2021: Dominant rhythm: Sinus. HR 56-137 bpm. Avg HR 82 bpm. 13 episodes of SVT, fastest at 193 bpm for 7.5 secs, longest for 12.8 secs at 160 bpm., without any reported symptoms. <1% isolated SVE, couplet/triplets. 0 episodes of VT <1% isolated VE, couplets. No triplets. No atrial fibrillation/atrial flutter/VT/high grade AV block, sinus pause >3sec  noted. 114 patient triggered events, mostly while in sinus rhythm.   Exercise Sestamibi stress test 11/24/2020: Exercise nuclear stress test was performed using Bruce protocol. Patient reached 7 METS, and 83% of age predicted maximum heart rate. Exercise capacity was fair. No chest pain reported. Normal heart rate and hypertensive blood pressure response (peak BP 224/88 mmHg). Peak EKG/ECG demonstrated normal sinus tachycardia, <1 mm ST depression in leads II, III, aVF, V4-V6, which is negative for ischemia. Normal myocardial  perfusion. Stress LVEF 73%. Low risk study.  Echocardiogram 01/04/2019: Normal LV systolic function with EF 65%. Left ventricle cavity is normal in size. Mild concentric hypertrophy of the left ventricle. Normal global wall motion. Doppler evidence of grade I (impaired) diastolic dysfunction, normal LAP.  Left atrial cavity is mildly dilated. Mild (Grade I) mitral regurgitation. Normal right atrial pressure.    Recent labs: 01/28/2023: Glucose 85, BUN/Cr 13/0.67. EGFR 96. Na/K 142/4.4.   10/2020: Glucose 99, BUN/Cr 17/0.77. EGFR 81. Na/K 146/4. Rest of the CMP normal H/H 38.7/12.8. MCV 86.8. Platelets 207 HbA1C 6.0 % Chol 170, TG 49, HDL 65, LDL 95 TSH 2.7 normal   Review of Systems  Cardiovascular:  Negative for chest pain, dyspnea on exertion, leg swelling, palpitations and syncope.        Vitals:   02/25/23 1146  BP: 136/72  Pulse: 62  Resp: 16  SpO2: 94%      Body mass index is 32.62 kg/m. Filed Weights   02/25/23 1146  Weight: 196 lb (88.9 kg)       Objective:  Physical Exam Vitals and nursing note reviewed.  Constitutional:      General: She is not in acute distress. Neck:     Vascular: No JVD.  Cardiovascular:     Rate and Rhythm: Normal rate and regular rhythm.     Pulses: Normal pulses.     Heart sounds: Normal heart sounds. No murmur heard. Pulmonary:     Effort: Pulmonary effort is normal.     Breath sounds: Normal breath sounds. No wheezing or rales.  Chest:     Chest wall: No tenderness.  Musculoskeletal:     Right lower leg: No edema.     Left lower leg: No edema.          Assessment & Recommendations:   67 y.o. Caucasian female with hypertension, hyperlipidemia, [name], mild obesity, with chest pain.  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 Chest pain likely noncardiac.   Nonetheless, recommend aggressive risk factor modification given calcium score of 67th percentile, aortic atherosclerosis.   Aspirin  is not necessary, but recommend high intensity statin.   Unable to take Lipitor >20 mg due to myalgias. Check lipid panel.  If LDL >70, she is open to starting Repatha.  Hypertension:  Controlled.  Okay to stop diltiazem, due to side effects of leg swelling.  Lipid panel now and repeat in 6 months. F/u in 6 months.    Elder Negus, MD Pager: [phone] Office: [phone]

## 2023-03-02 LAB — LIPID PANEL
Chol/HDL Ratio: 3.2 ratio (ref 0.0–4.4)
Cholesterol, Total: 178 mg/dL (ref 100–199)
HDL: 55 mg/dL (ref >39)
LDL Chol Calc (NIH): 110 mg/dL — ABNORMAL HIGH (ref 0–99)
Triglycerides: 71 mg/dL (ref 0–149)
VLDL Cholesterol Cal: 13 mg/dL (ref 5–40)

## 2023-03-03 ENCOUNTER — Other Ambulatory Visit (HOSPITAL_BASED_OUTPATIENT_CLINIC_OR_DEPARTMENT_OTHER): Payer: Self-pay

## 2023-03-03 MED ORDER — MOUNJARO 7.5 MG/0.5ML ~~LOC~~ SOAJ
7.5000 mg | SUBCUTANEOUS | 3 refills | Status: DC
  Filled 2023-03-03: qty 2, 28d supply, fill #0

## 2023-03-03 MED ORDER — REPATHA SURECLICK 140 MG/ML ~~LOC~~ SOAJ: 140.0000 mg | SUBCUTANEOUS | 5 refills | Status: DC

## 2023-03-03 NOTE — Progress Notes (Signed)
No answer. LVM ?

## 2023-03-03 NOTE — Addendum Note (Signed)
Addended by: Elder Negus on: 03/03/2023 04:09 PM   Modules accepted: Orders

## 2023-03-08 NOTE — Progress Notes (Signed)
Called and spoke with patient regarding her results. Patient stated that pharmacy told her that her Repatha will not be covered, even after insurance. Is there anything you want as an alternative. Please advise.

## 2023-03-08 NOTE — Progress Notes (Signed)
Called patient no answer left a vm

## 2023-03-13 NOTE — Progress Notes (Signed)
Hello [name],  This patient is nonobstructive CAD, lipids not at goal, and myalgias due to statins. Could we consider PCSK9i? She is willing to consider it. Thank you for your assistance.  Thanks MJP

## 2023-04-05 ENCOUNTER — Other Ambulatory Visit (HOSPITAL_BASED_OUTPATIENT_CLINIC_OR_DEPARTMENT_OTHER): Payer: Self-pay

## 2023-04-05 MED ORDER — MOUNJARO 10 MG/0.5ML ~~LOC~~ SOAJ
10.0000 mg | SUBCUTANEOUS | 3 refills | Status: DC
  Filled 2023-04-05: qty 2, 28d supply, fill #0
  Filled 2023-05-05: qty 2, 28d supply, fill #1
  Filled 2023-06-03: qty 2, 28d supply, fill #2
  Filled 2023-07-05: qty 2, 28d supply, fill #3

## 2023-06-03 LAB — LAB REPORT - SCANNED
A1c: 6.1
Albumin, Urine POC: 3
Creatinine, POC: 86.2 mg/dL
EGFR: 82
Microalb Creat Ratio: 3

## 2023-07-05 ENCOUNTER — Other Ambulatory Visit (HOSPITAL_BASED_OUTPATIENT_CLINIC_OR_DEPARTMENT_OTHER): Payer: Self-pay

## 2023-07-07 ENCOUNTER — Other Ambulatory Visit (HOSPITAL_BASED_OUTPATIENT_CLINIC_OR_DEPARTMENT_OTHER): Payer: Self-pay

## 2023-08-05 ENCOUNTER — Other Ambulatory Visit (HOSPITAL_BASED_OUTPATIENT_CLINIC_OR_DEPARTMENT_OTHER): Payer: Self-pay

## 2023-08-08 ENCOUNTER — Other Ambulatory Visit (HOSPITAL_BASED_OUTPATIENT_CLINIC_OR_DEPARTMENT_OTHER): Payer: Self-pay

## 2023-08-08 MED ORDER — MOUNJARO 10 MG/0.5ML ~~LOC~~ SOAJ
10.0000 mg | SUBCUTANEOUS | 3 refills | Status: DC
  Filled 2023-08-08: qty 2, 28d supply, fill #0
  Filled 2023-09-06: qty 2, 28d supply, fill #1
  Filled 2023-10-04: qty 2, 28d supply, fill #2
  Filled 2023-10-28: qty 2, 28d supply, fill #3

## 2023-08-09 ENCOUNTER — Encounter: Payer: Self-pay | Admitting: Family Medicine

## 2023-08-09 ENCOUNTER — Ambulatory Visit: Payer: 59 | Admitting: Family Medicine

## 2023-08-09 ENCOUNTER — Ambulatory Visit
Admission: RE | Admit: 2023-08-09 | Discharge: 2023-08-09 | Disposition: A | Discharge status: Home / Self Care | Payer: 59 | Source: Ambulatory Visit | Attending: Family Medicine | Admitting: Family Medicine

## 2023-08-09 VITALS — BP 126/88 | Ht 65.0 in | Wt 176.0 lb

## 2023-08-09 DIAGNOSIS — M5412 Radiculopathy, cervical region: Secondary | ICD-10-CM

## 2023-08-09 DIAGNOSIS — R29898 Other symptoms and signs involving the musculoskeletal system: Secondary | ICD-10-CM

## 2023-08-09 MED ORDER — PREDNISONE 10 MG PO TABS: ORAL_TABLET | ORAL | 0 refills | Status: AC

## 2023-08-09 NOTE — Progress Notes (Unsigned)
DATE OF VISIT: 08/09/2023        [facility] DOB: Jun 26, 1955 MRN: [id number]  CC:  Rt shoulder pain  History- [name] is a 68 y.o. RT-hand dominant female for evaluation and treatment of Rt shoulder/arm pain Pain x 1.5 weeks No injury/trauma Was watching granddaugther  - but was not really picking her up much Feeling weaker in the arm - trouble lifting the right arm unless she uses the left arm to help Some numbness down the right arm to the hand No prior shoulder issues Had lithotripsy last week for kidney stone - symptoms were present prior to procedure - taking NSAIDs (Ketorolac bid prn) for this -- has been helping with shoulder - no NSAIDs in the last 2 days  Job: Aeronautical engineer - primarily desk work   Past Medical History Past Medical History:  Diagnosis Date   Abnormal Pap smear of cervix    yrs ago   Adenomyosis    Adenomyosis    Allergy    Arthritis    Cancer (HCC)    skin cancer    Chest pain    Diverticulosis    DM (diabetes mellitus) (HCC)    Dyspareunia    GERD (gastroesophageal reflux disease)    HLD (hyperlipidemia) 01/04/2019   Hyperlipemia    Hypertension    Neuromuscular disorder (HCC)    slight neuropathy feet    PONV (postoperative nausea and vomiting)    Urinary incontinence     Past Surgical History Past Surgical History:  Procedure Laterality Date   CARDIAC CATHETERIZATION  02/16/10   normal coronaries, normal LV function,EF >55%   COLONOSCOPY     age 38- normal per pt    NOSE SURGERY     deviated septum    TUBAL LIGATION  1996   TUMOR REMOVAL  1979   ovary   VAGINAL HYSTERECTOMY  12/05    Medications Current Outpatient Medications  Medication Sig Dispense Refill   predniSONE (DELTASONE) 10 MG tablet Use as directed per doctors orders for the next 6 days. 21 tablet 0   albuterol (VENTOLIN HFA) 108 (90 Base) MCG/ACT inhaler 1 PUFF AS NEEDED EVERY 6 HRS INHALATION 30 DAYS     atorvastatin  (LIPITOR) 40 MG tablet Take 20 mg by mouth daily.     Cholecalciferol (VITAMIN D3) 50 MCG (2000 UT) CHEW Chew by mouth 2 (two) times a day.     HYDROcodone-acetaminophen (NORCO/VICODIN) 5-325 MG tablet Take 1 tablet by mouth as needed.     ibuprofen (ADVIL) 600 MG tablet Take 1 tablet by mouth as needed.     LORazepam (ATIVAN) 0.5 MG tablet Take 0.5 mg by mouth as needed for anxiety.     losartan-hydrochlorothiazide (HYZAAR) 50-12.5 MG tablet Take 1 tablet by mouth daily. 90 tablet 3   metoprolol succinate (TOPROL-XL) 50 MG 24 hr tablet Take 50 mg by mouth daily. Take with or immediately following a meal.     nitroGLYCERIN (NITROSTAT) 0.4 MG SL tablet Place 1 tablet (0.4 mg total) under the tongue every 5 (five) minutes as needed for chest pain. 30 tablet 3   tamsulosin (FLOMAX) 0.4 MG CAPS capsule Take 1 capsule by mouth as needed.     tirzepatide (MOUNJARO) 10 MG/0.5ML Pen Inject 10 mg into the skin once a week. 2 mL 3   No current facility-administered medications for this visit.    Allergies is allergic to amoxicillin and lisinopril.  Family History - reviewed per EMR and  intake form  Social History   reports current alcohol use of about 1.0 standard drink of alcohol per week.  reports that she quit smoking about 30 years ago. Her smoking use included cigarettes. She started smoking about 50 years ago. She has a 5 pack-year smoking history. She has never used smokeless tobacco.  reports no history of drug use. OCCUPATION: Aeronautical engineer - primarily desk work   EXAM: Vitals: BP 126/88   Ht 5\' 5"  (1.651 m)   Wt 176 lb (79.8 kg)   LMP 05/21/2004   BMI 29.29 kg/m  General: AOx3, NAD, pleasant SKIN: no rashes or lesions, skin clean, dry, intact MSK: C-spine: Full range of motion with slight pain with left and right rotation.  No midline tenderness.  Mild bilateral paraspinal tenderness at C5, C6, C7.  Mild right sided pain with Spurling's, but no reproduction of radicular  symptoms SHOULDER: Right shoulder without any gross deformity.  No tenderness over the [facility] joint, bicipital groove, greater tuberosity.  Significantly decreased AROM  - flexion 110-deg, abduction 75-deg - full PROM in all planes without pain RTC weakness - abduction 3/5, ER 3/5, IR 4-/5, bicep 3/5, tricep 5-/5 Normal grip strength, normal intrinsic/extrinsic hand strength Negative Hawkins, negative Neer, positive empty can, negative drop arm Left shoulder with full range of motion without pain, weakness, instability  NEURO: sensation intact to light touch, DTR + 1/4 right bicep and brachioradialis, +2/4 right triceps and left bicep, tricep, brachioradialis VASC: pulses 2+ and symmetric radial artery bilaterally, no edema   Assessment & Plan Right cervical radiculopathy Acute right arm/shoulder pain with associated right arm weakness with abduction, flexion, external rotation, as well as decreased reflexes of the right bicep and brachial radialis.  Exam abnormalities correlate with the C5-C6 nerve root.  Plan: -Imaging: C-spine x-ray and right shoulder x-ray to rule out bony abnormality.  Will consider MRI C-spine pending x-ray results -6-day prednisone Dosepak to take as directed to calm down pain and inflammation -Red flag symptoms reviewed, she is aware when to reach out or seek medical treatment -Follow-up 1 to 2 weeks for reevaluation, sooner as needed Right arm weakness Acute right arm/shoulder pain with associated right arm weakness with abduction, flexion, external rotation, as well as decreased reflexes of the right bicep and brachial radialis.  Exam abnormalities correlate with the C5-C6 nerve root.  Plan: -Imaging: C-spine x-ray and right shoulder x-ray to rule out bony abnormality.  Will consider MRI C-spine pending x-ray results -6-day prednisone Dosepak to take as directed to calm down pain and inflammation -Red flag symptoms reviewed, she is aware when to reach out or seek  medical treatment -Follow-up 1 to 2 weeks for reevaluation, sooner as needed   Patient expressed understanding & agreement with above.  Encounter Diagnoses  Name Primary?   Right cervical radiculopathy Yes   Right arm weakness     Orders Placed This Encounter  Procedures   DG Cervical Spine 2 or 3 views   DG Shoulder Right    Orders Placed This Encounter  Procedures   DG Cervical Spine 2 or 3 views   DG Shoulder Right

## 2023-08-10 ENCOUNTER — Encounter: Payer: Self-pay | Admitting: Family Medicine

## 2023-08-10 ENCOUNTER — Encounter: Payer: Self-pay | Admitting: *Deleted

## 2023-08-10 ENCOUNTER — Other Ambulatory Visit: Payer: Self-pay | Admitting: *Deleted

## 2023-08-10 DIAGNOSIS — M5412 Radiculopathy, cervical region: Secondary | ICD-10-CM

## 2023-08-10 DIAGNOSIS — R29898 Other symptoms and signs involving the musculoskeletal system: Secondary | ICD-10-CM

## 2023-08-12 ENCOUNTER — Encounter: Payer: Self-pay | Admitting: Family Medicine

## 2023-08-16 ENCOUNTER — Ambulatory Visit: Payer: 59 | Admitting: Cardiology

## 2023-08-21 ENCOUNTER — Ambulatory Visit
Admission: RE | Admit: 2023-08-21 | Discharge: 2023-08-21 | Disposition: A | Discharge status: Home / Self Care | Payer: 59 | Source: Ambulatory Visit | Attending: Family Medicine | Admitting: Family Medicine

## 2023-08-21 DIAGNOSIS — R29898 Other symptoms and signs involving the musculoskeletal system: Secondary | ICD-10-CM

## 2023-08-21 DIAGNOSIS — M5412 Radiculopathy, cervical region: Secondary | ICD-10-CM

## 2023-08-23 ENCOUNTER — Other Ambulatory Visit: Payer: Self-pay

## 2023-08-23 ENCOUNTER — Ambulatory Visit: Payer: 59 | Admitting: Family Medicine

## 2023-08-23 ENCOUNTER — Encounter: Payer: Self-pay | Admitting: Family Medicine

## 2023-08-23 VITALS — BP 138/84 | Ht 65.0 in | Wt 173.0 lb

## 2023-08-23 DIAGNOSIS — M7531 Calcific tendinitis of right shoulder: Secondary | ICD-10-CM | POA: Diagnosis not present

## 2023-08-23 DIAGNOSIS — M75111 Incomplete rotator cuff tear or rupture of right shoulder, not specified as traumatic: Secondary | ICD-10-CM | POA: Diagnosis not present

## 2023-08-23 DIAGNOSIS — M25511 Pain in right shoulder: Secondary | ICD-10-CM

## 2023-08-23 DIAGNOSIS — M4722 Other spondylosis with radiculopathy, cervical region: Secondary | ICD-10-CM

## 2023-08-23 NOTE — Progress Notes (Signed)
 DATE OF VISIT: 08/23/2023        [name] DOB: April 08, 1956 MRN: [id number]  CC:  f/u Rt shoulder/Rt arm pain  History of present Illness: [name] is a 68 y.o. female who presents for a follow-up visit  Last seen by me about 2 weeks ago - given 6-Payne Prednisone taper - Xrays showing cervical spondylosis - had MRI 08/21/23 - report still pending  Today she reports feeling a little better She has decreased tension along her neck and her shoulder Still feeling very weak in the right arm.  Having trouble abducting the right arm above 90 degrees.  She is able to abduct the arm over 90 degrees without gravity and when she is laying flat Still occasionally having numbness tingling down the arm to the hand She thinks the prednisone was helpful No new injury or trauma  Medications:  Outpatient Encounter Medications as of 08/23/2023  Medication Sig   albuterol (VENTOLIN HFA) 108 (90 Base) MCG/ACT inhaler 1 PUFF AS NEEDED EVERY 6 HRS INHALATION 30 DAYS   atorvastatin (LIPITOR) 40 MG tablet Take 20 mg by mouth daily.   Cholecalciferol (VITAMIN D3) 50 MCG (2000 UT) CHEW Chew by mouth 2 (two) times a Payne.   HYDROcodone-acetaminophen (NORCO/VICODIN) 5-325 MG tablet Take 1 tablet by mouth as needed.   ibuprofen (ADVIL) 600 MG tablet Take 1 tablet by mouth as needed.   LORazepam (ATIVAN) 0.5 MG tablet Take 0.5 mg by mouth as needed for anxiety.   losartan-hydrochlorothiazide (HYZAAR) 50-12.5 MG tablet Take 1 tablet by mouth daily.   metoprolol succinate (TOPROL-XL) 50 MG 24 hr tablet Take 50 mg by mouth daily. Take with or immediately following a meal.   predniSONE (DELTASONE) 10 MG tablet Use as directed per doctors orders for the next 6 days.   tamsulosin (FLOMAX) 0.4 MG CAPS capsule Take 1 capsule by mouth as needed.   tirzepatide (MOUNJARO) 10 MG/0.5ML Pen Inject 10 mg into the skin once a week.   No facility-administered encounter medications on file as of 08/23/2023.     Allergies: is allergic to amoxicillin and lisinopril.  Physical Examination: Vitals: BP 138/84   Ht 5\' 5"  (1.651 m)   Wt 173 lb (78.5 kg)   LMP 05/21/2004   BMI 28.79 kg/m  GENERAL:  [name] is a 68 y.o. female appearing their stated age, alert and oriented x 3, in no apparent distress.  SKIN: no rashes or lesions, skin clean, dry, intact MSK:  C-spine: Full range of motion without any pain.  No midline or paraspinal tenderness.  Negative Spurling's bilaterally Shoulder: Right shoulder without any gross deformity.  Tender to palpation over the bicipital groove and greater tuberosity, also some mild tenderness and hypertonia over the medial scapular border. Significantly decreased active range of motion - flexion lacks approximately 15 to 20 degrees, abduction up to 90 degrees.  Near full internal and external rotation. -Rotator cuff weakness abduction 3/5, external rotation 4/5, internal rotation 5-/5.  Bicep 5 -/5, tricep 5 -/5 Normal grip strength, normal intrinsic/extrinsic hand strength Negative Hawkins, negative Neer, positive empty can, negative drop arm Left shoulder with full range of motion without pain, weakness, instability NEURO: sensation intact to light touch, DTR 2/4 bicep, tricep, brachioradialis bilaterally VASC: pulses 2+ and symmetric radial artery bilaterally, no edema  Radiology: XRAY:  Right shoulder x-ray 08/09/2023 showing: -9 mm calcific density adjacent to the greater tuberosity consistent with calcific tendinitis.  No other bony abnormalities  C-spine x-ray 08/09/2023  showing: -Mild diffuse osteoarthritis changes with minimal anterior endplate spurring involving C3-C5 vertebral bodies and C6 vertebral bodies with narrowing of the C5-6 disc space anteriorly and posteriorly with minimal posterior bony spondylosis  MRI:  MRI C-spine 08/21/2023 showing: IMPRESSION: 1. Moderate to moderately severe right foraminal narrowing at C3-4 due to  facet arthropathy and uncovertebral disease. Mild left foraminal narrowing at this level. 2. Mild to moderate foraminal narrowing at C4-5 is worse on the left. 3. Mild right foraminal narrowing at C2-3.  ULTRASOUND: MSK ultrasound right shoulder Date: 08/23/23 Indication: Right shoulder pain Findings: -Biceps tendon with hypoechoic change and intrasubstance split tear along the proximal aspect of the long head of the biceps.  No significant fluid in the biceps tendon sheath.  Biceps is situated in the bicipital groove -Normal pectoralis major attachment to the proximal humerus -Subscapularis with an articular sided partial tear.  Some cortical irregularity along the humeral head is noted -AC joint with moderate degenerative changes and geyser sign -Supraspinatus with approximately 7.3 mm calcification along the distal aspect, associated acoustic shadowing is appreciated.  Suspect underlying articular sided tear, but unable to completely assess due to acoustic shadowing from the calcification -Normal-appearing infraspinatus and teres minor -Normal-appearing posterior glenohumeral joint  Impression: -Tendinopathy with suspected intrasubstance partial tear -Partial subscapularis tear -Calcific tendinosis of the supraspinatus with suspected underlying supraspinatus tear -AC joint osteoarthritis Images and interpretation completed by Darene Lamer, DO   Assessment & Plan Nontraumatic incomplete tear of right rotator cuff MSK ultrasound and x-ray showing calcific tendinosis of the supraspinatus, but also ultrasound showing subscap and likely supraspinatus tears.  Difficult to completely visualize due to large calcification and acoustic shadowing -Patient did have some improvement with oral prednisone, but still with significant weakness and range of motion deficits concerning for large rotator cuff injury that may require surgical intervention  Plan: -MSK ultrasound completed in the office  today as noted above -MRI right shoulder to further evaluate rotator cuff, has already completed x-ray and MSK ultrasound.  Has significant range of motion and strength deficits on exam.  Concerning for rotator cuff tear that may require surgical intervention.  She would be interested in surgery if needed. -Given range of motion exercises to help with symptoms while awaiting the MRI -Heat or ice as needed -Follow-up after MRI to discuss results and further treatment Calcific tendinitis of right shoulder MSK ultrasound and x-ray showing calcific tendinosis of the supraspinatus, but also ultrasound showing subscap and likely supraspinatus tears.  Plan: -X-ray findings and MSK ultrasound findings reviewed -MRI right shoulder ordered to further evaluate as noted above Cervical spondylosis with radiculopathy Neck pain improved with oral steroids, still with significant right arm weakness as well as intermittent radicular symptoms -MRI C-spine showing moderate to severe right foraminal narrowing at C3-4 and moderate narrowing at C4-5 that is worse on the left, and mild narrowing at C2-3 on the right.  Plan: -Will obtain MRI of the right shoulder to help delineate if symptoms are more so related to referred neck pathology versus right shoulder issue -Was given home exercises to help with the neck while awaiting shoulder MRI -Heat or ice as needed -Will discuss further treatment pending MRI of the shoulder  Patient expressed understanding & agreement with above.  Encounter Diagnoses  Name Primary?   Nontraumatic incomplete tear of right rotator cuff Yes   Calcific tendinitis of right shoulder    Cervical spondylosis with radiculopathy     Orders Placed This Encounter  Procedures  [place] LIMITED JOINT SPACE STRUCTURES UP RIGHT   MR SHOULDER RIGHT WO CONTRAST

## 2023-08-23 NOTE — Patient Instructions (Signed)
 Hello Katherine Ang-  As discussed, we ordered an MRI of your shoulder to further evaluate to take a closer look at the tears in the rotator cuff.  Please find attached some exercises for the neck and the shoulder that you can start doing in the mean time.  I should receive the MRI results within 5-7 days of the scan.  If you send me a message once you have it scheduled, I can watch for the results.  Please let me know if you have any questions.  Regards, Darene Lamer, DO

## 2023-08-25 ENCOUNTER — Ambulatory Visit: Payer: Self-pay | Admitting: Cardiology

## 2023-09-04 ENCOUNTER — Ambulatory Visit
Admission: RE | Admit: 2023-09-04 | Discharge: 2023-09-04 | Disposition: A | Discharge status: Home / Self Care | Source: Ambulatory Visit | Attending: Family Medicine | Admitting: Family Medicine

## 2023-09-04 DIAGNOSIS — M75111 Incomplete rotator cuff tear or rupture of right shoulder, not specified as traumatic: Secondary | ICD-10-CM

## 2023-09-09 ENCOUNTER — Encounter: Payer: Self-pay | Admitting: Family Medicine

## 2023-09-21 ENCOUNTER — Encounter: Payer: Self-pay | Admitting: Cardiology

## 2023-09-21 ENCOUNTER — Ambulatory Visit: Payer: 59 | Attending: Cardiology | Admitting: Cardiology

## 2023-09-21 VITALS — BP 122/78 | HR 68 | Resp 16 | Ht 65.0 in | Wt 170.2 lb

## 2023-09-21 DIAGNOSIS — I10 Essential (primary) hypertension: Secondary | ICD-10-CM

## 2023-09-21 DIAGNOSIS — R931 Abnormal findings on diagnostic imaging of heart and coronary circulation: Secondary | ICD-10-CM | POA: Insufficient documentation

## 2023-09-21 DIAGNOSIS — E782 Mixed hyperlipidemia: Secondary | ICD-10-CM

## 2023-09-21 NOTE — Patient Instructions (Signed)
 Medication Instructions:   Your physician recommends that you continue on your current medications as directed. Please refer to the Current Medication list given to you today.  *If you need a refill on your cardiac medications before your next appointment, please call your pharmacy*   Follow-Up:  As needed with Dr. Rosemary Holms        1st Floor: - Lobby - Registration  - Pharmacy  - Lab - Cafe  2nd Floor: - PV Lab - Diagnostic Testing (echo, CT, nuclear med)  3rd Floor: - Vacant  4th Floor: - TCTS (cardiothoracic surgery) - AFib Clinic - Structural Heart Clinic - Vascular Surgery  - Vascular Ultrasound  5th Floor: - [facility] (general and EP) - Clinical Pharmacy for coumadin, hypertension, lipid, weight-loss medications, and med management appointments    Valet parking services will be available as well.

## 2023-09-21 NOTE — Progress Notes (Signed)
  Cardiology Office Note:  .   Date:  09/21/2023  ID:  [name], DOB 04/10/1956, MRN [id number] PCP: Georgianne Fick, MD  [facility] Providers Cardiologist:  Truett Mainland, MD PCP: Georgianne Fick, MD  Chief Complaint  Patient presents with   Hypertension   Follow-up     [name] is a 68 y.o. female with hypertension, hyperlipidemia, [name], elevated CAC  Patient is doing very well.  She has lost several pounds after being on Mounjaro.  She is trying to stay active, is eating more protein and less carbs.  She does admit to eating red meat 2-3 times a month.  Reviewed lipid panel in December 2024, details below.  She denies any chest pain, shortness of breath or any other symptoms.    Vitals:   09/21/23 0808  BP: 122/78  Pulse: 68  Resp: 16  SpO2: 97%      Review of Systems  Cardiovascular:  Negative for chest pain, dyspnea on exertion, leg swelling, palpitations and syncope.        Studies Reviewed: Marland Kitchen         Independently interpreted 05/2023: Chol 162, TG 56, HDL 53, LDL 98, HbA1C 6.1% Hb 13.6   CCTA 02/02/2023: 1. Coronary calcium score of 38.6 AU. This was 67th percentile for age-, sex, and race-matched controls.  2. Total plaque volume (TPV) 186 mm3 which is 50th percentile for age- and sex-matched controls (calcified plaque 11 mm3; non-calcified plaque 175 mm3 of which 12 mm3 is low attenuation plaque). TPV of 186 mm3 is moderate.  3. Normal coronary origin with right dominance.  4. CAD-RADS = 2 Mild non-obstructive CAD (25-49%).  5.  Aortic atherosclerosis.     Physical Exam Vitals and nursing note reviewed.  Constitutional:      General: She is not in acute distress. Neck:     Vascular: No JVD.  Cardiovascular:     Rate and Rhythm: Normal rate and regular rhythm.     Heart sounds: Normal heart sounds. No murmur heard. Pulmonary:     Effort: Pulmonary effort is normal.     Breath  sounds: Normal breath sounds. No wheezing or rales.  Musculoskeletal:     Right lower leg: No edema.     Left lower leg: No edema.      VISIT DIAGNOSES:   ICD-10-CM   1. Essential hypertension  I10     2. Mixed hyperlipidemia  E78.2     3. Elevated coronary artery calcium score  R93.1       Assessment & Plan:  [name] is a 68 y.o. female with  hypertension, hyperlipidemia, [name], elevated CAC  Hypertension: Controlled  Mixed hyperlipidemia: LDL 98 on Lipitor 20 mg daily. Unable to take >20 mg due to myalgia. Discussed further diet changes, reducing red meat.  Check lipid panel with PCP in May.  If LDL remains >70, could consider adding Zetia 10 mg daily, Repatha.  Will defer to PCP.  Given overall cardiac stability and no ongoing acute cardiac issues, I will see her as needed.   Signed, Elder Negus, MD

## 2023-10-22 ENCOUNTER — Other Ambulatory Visit: Payer: Self-pay | Admitting: Cardiology

## 2023-11-29 LAB — LAB REPORT - SCANNED
A1c: 5.5
Albumin, Urine POC: 8.4
Creatinine, POC: 200.5 mg/dL
EGFR: 87
Microalb Creat Ratio: 4

## 2023-11-30 ENCOUNTER — Other Ambulatory Visit (HOSPITAL_BASED_OUTPATIENT_CLINIC_OR_DEPARTMENT_OTHER): Payer: Self-pay

## 2023-12-02 ENCOUNTER — Other Ambulatory Visit (HOSPITAL_BASED_OUTPATIENT_CLINIC_OR_DEPARTMENT_OTHER): Payer: Self-pay

## 2023-12-06 ENCOUNTER — Other Ambulatory Visit (HOSPITAL_BASED_OUTPATIENT_CLINIC_OR_DEPARTMENT_OTHER): Payer: Self-pay

## 2023-12-06 MED ORDER — MOUNJARO 10 MG/0.5ML ~~LOC~~ SOAJ
10.0000 mg | SUBCUTANEOUS | 1 refills | Status: AC
Start: 2023-12-06 — End: ?
  Filled 2023-12-06: qty 2, 28d supply, fill #0

## 2023-12-06 MED ORDER — MOUNJARO 10 MG/0.5ML ~~LOC~~ SOAJ
10.0000 mg | SUBCUTANEOUS | 3 refills | Status: AC
  Filled 2023-12-06: qty 2, 28d supply, fill #0
  Filled 2023-12-28: qty 2, 28d supply, fill #1
  Filled 2024-01-16: qty 2, 28d supply, fill #2

## 2024-01-16 ENCOUNTER — Other Ambulatory Visit (HOSPITAL_BASED_OUTPATIENT_CLINIC_OR_DEPARTMENT_OTHER): Payer: Self-pay

## 2024-01-16 MED ORDER — MOUNJARO 12.5 MG/0.5ML ~~LOC~~ SOAJ
12.5000 mg | SUBCUTANEOUS | 1 refills | Status: DC
  Filled 2024-01-16: qty 2, 28d supply, fill #0
  Filled 2024-02-21: qty 2, 28d supply, fill #1
  Filled 2024-03-20: qty 2, 28d supply, fill #2
  Filled 2024-04-17: qty 2, 28d supply, fill #3
  Filled 2024-05-14: qty 2, 28d supply, fill #4
  Filled 2024-06-11: qty 2, 28d supply, fill #5

## 2024-04-18 ENCOUNTER — Other Ambulatory Visit (HOSPITAL_BASED_OUTPATIENT_CLINIC_OR_DEPARTMENT_OTHER): Payer: Self-pay

## 2024-04-30 ENCOUNTER — Encounter: Payer: Self-pay | Admitting: Family Medicine

## 2024-04-30 ENCOUNTER — Ambulatory Visit: Admitting: Family Medicine

## 2024-04-30 VITALS — BP 130/80 | Ht 65.5 in | Wt 150.0 lb

## 2024-04-30 DIAGNOSIS — M75111 Incomplete rotator cuff tear or rupture of right shoulder, not specified as traumatic: Secondary | ICD-10-CM | POA: Diagnosis not present

## 2024-04-30 DIAGNOSIS — M7531 Calcific tendinitis of right shoulder: Secondary | ICD-10-CM | POA: Diagnosis not present

## 2024-04-30 DIAGNOSIS — M4722 Other spondylosis with radiculopathy, cervical region: Secondary | ICD-10-CM | POA: Diagnosis not present

## 2024-04-30 MED ORDER — METHYLPREDNISOLONE ACETATE 40 MG/ML IJ SUSP
40.0000 mg | Freq: Once | INTRAMUSCULAR | Status: AC
Start: 2024-04-30 — End: 2024-04-30
  Administered 2024-04-30: 40 mg via INTRA_ARTICULAR

## 2024-04-30 NOTE — Progress Notes (Signed)
 DATE OF VISIT: 04/30/2024        [name] DOB: Mar 09, 1956 MRN: [id number]  Discussed the use of AI scribe software for clinical note transcription with the patient, who gave verbal consent to proceed.  History of Present Illness [name] is a 68 year old female who presents with right shoulder pain.  Right shoulder pain and dysfunction - Seen by me 08/23/2023.  Was doing well, but started to get increasing pain in the right shoulder over the last several weeks - Pain radiates down the arm to the forearm at times and is severe enough to provoke emotional distress - Occasional 'popping' sensation in the shoulder, which temporarily relieves pain - Pain is exacerbated by movement, specifically overhead activity; relief achieved with rest and immobilization - Uses a special pillow to stabilize her head during sleep, which may help with perceived instability  Neurological symptoms of the right upper extremity - Tingling and numbness extending along the forearm - No weakness in the hand  Imaging findings of the right shoulder - MRI on September 04, 2023, demonstrated calcific tendinosis with a 13x6x10 mm calcification in the infraspinatus and a 6 mm calcification in the subscapularis - Mild to moderate supraspinatus tendinosis with a possible mid-substance tear - Mild to moderate long head biceps tendinosis  Pain management and medication use - Uses ibuprofen for pain control - Applies topical Voltaren gel and Lanocaine for symptomatic relief - Topical and oral medications provide relief as long as the shoulder remains immobile  Medication allergies and contraindications - Allergic to amoxicillin and lisinopril - Not taking any blood thinners    Medications:  Outpatient Encounter Medications as of 04/30/2024  Medication Sig   albuterol (VENTOLIN HFA) 108 (90 Base) MCG/ACT inhaler 1 PUFF AS NEEDED EVERY 6 HRS INHALATION 30 DAYS   atorvastatin  (LIPITOR) 20 MG  tablet Take 20 mg by mouth daily.   Cholecalciferol (VITAMIN D3) 50 MCG (2000 UT) CHEW Chew by mouth 2 (two) times a day.   HYDROcodone-acetaminophen (NORCO/VICODIN) 5-325 MG tablet Take 1 tablet by mouth as needed.   ibuprofen (ADVIL) 600 MG tablet Take 1 tablet by mouth as needed.   LORazepam (ATIVAN) 0.5 MG tablet Take 0.5 mg by mouth as needed for anxiety.   losartan -hydrochlorothiazide (HYZAAR) 50-12.5 MG tablet TAKE 1 TABLET BY MOUTH EVERY DAY   metoprolol  succinate (TOPROL -XL) 50 MG 24 hr tablet Take 50 mg by mouth daily. Take with or immediately following a meal.   predniSONE  (DELTASONE ) 10 MG tablet Use as directed per doctors orders for the next 6 days.   tamsulosin (FLOMAX) 0.4 MG CAPS capsule Take 1 capsule by mouth as needed.   tirzepatide  (MOUNJARO ) 10 MG/0.5ML Pen Inject 10 mg into the skin once a week.   tirzepatide  (MOUNJARO ) 10 MG/0.5ML Pen Inject 10 mg into the skin once a week.   tirzepatide  (MOUNJARO ) 12.5 MG/0.5ML Pen Inject 12.5 mg into the skin once a week.   [EXPIRED] methylPREDNISolone acetate (DEPO-MEDROL) injection 40 mg    No facility-administered encounter medications on file as of 04/30/2024.    Allergies: is allergic to amoxicillin and lisinopril.  Physical Examination: Vitals: BP 130/80   Ht 5' 5.5 (1.664 m)   Wt 150 lb (68 kg)   LMP 05/21/2004   BMI 24.58 kg/m  GENERAL:  [name] is a 68 y.o. female appearing their stated age, alert and oriented x 3, in no apparent distress.  SKIN: no rashes or lesions, skin clean, dry, intact MSK:  C-spine: No gross abnormalities.  No midline tenderness.  No right-sided paraspinal tenderness.  Negative Spurling's bilaterally Right shoulder: No gross deformity.  Tender palpation along the bicipital groove and greater tuberosity.  Near full range of motion with positive painful arc.  Positive empty can, positive Hawkins, positive Neer, negative drop arm.  Rotator cuff strength 5 -/5 throughout.  Left  shoulder with full range of motion pain, weakness, instability Normal grip strength bilaterally NEURO: sensation intact to light touch, DTR 2/4 bicep, tricep, brachial radialis bilaterally VASC: pulses 2+ and symmetric artery bilaterally, no edema  Radiology: MRI right shoulder without contrast 09/04/2023 showing: FINDINGS: Rotator cuff: There is decreased T1 and decreased T2 signal with blooming artifact indicating calcification within the bursal aspect of the anterior infraspinatus tendon footprint, as seen on prior radiographs. This calcification measures up to approximately 13 x 6 x 10 mm (transverse by craniocaudal by AP dimensions). There is mild-to-moderate intermediate T2 signal of the surrounding anterior infraspinatus tendon footprint (coronal series 107, image 14 and sagittal series 109, images 21 and 22). Possible tiny adjacent interstitial tear within the infraspinatus tendon just proximal to the calcification. Mild to moderate intermediate T2 signal supraspinatus tendinosis diffusely. There is a 3 mm subcortical cyst deep to the posterior supraspinatus tendon footprint with a possible minimal 1 mm overlying midsubstance tendon tear without tendon retraction (coronal series 107, image 11). Within the far anterior supraspinatus tendon footprint there is a small tear extending through the bursal tendon surface, measuring only 3 mm in AP dimension (sagittal image 20) and 3 mm in transverse dimension (coronal images 7 and 8). No tendon retraction. There is low signal with blooming artifact indicating a 6 mm calcification within the superior, anterior aspect of the midsubstance of the subscapularis tendon insertion (axial series 106, image 17). This is also seen on prior radiographs. The teres minor is intact.   Muscles: No rotator cuff muscle atrophy, fatty infiltration, or edema.   Biceps long head: Mild-to-moderate proximal long head of the biceps intermediate T2 signal  and thickening tendinosis.   Acromioclavicular Joint: There are moderate degenerative changes of the acromioclavicular joint including joint space narrowing, subchondral marrow edema, and peripheral osteophytosis. Type II acromion. Mild-to-moderate fluid within the subacromial/subdeltoid bursa.   Glenohumeral Joint: Mild-to-moderate glenoid cartilage thinning. No glenohumeral joint effusion.   Labrum: Grossly intact, but evaluation is limited by lack of intraarticular fluid.   Bones:  No acute fracture.   Other: None.   IMPRESSION: 1. Chronic calcific tendinosis of the anterior infraspinatus tendon footprint. Possible tiny adjacent interstitial tear within the infraspinatus tendon just proximal to the calcification. 2. Less severe chronic calcific tendinosis of the superior, anterior aspect of the midsubstance of the subscapularis tendon insertion. 3. Mild-to-moderate supraspinatus tendinosis. Possible minimal 1 mm midsubstance tear deep to the posterior supraspinatus tendon footprint without tendon retraction. Small 3 mm tear extending through the bursal tendon surface within the far anterior supraspinatus tendon footprint. No tendon retraction. 4. Mild-to-moderate proximal long head of the biceps tendinosis. 5. Moderate degenerative changes of the acromioclavicular joint. 6. Mild-to-moderate subacromial/subdeltoid bursitis.  Assessment & Plan Acute on chronic right shoulder pain with exacerbation, known calcific tendinitis of right shoulder with incomplete rotator cuff tear  - MRI shows calcifications in infraspinatus and subscapularis, with supraspinatus tendinosis and possible tear. Discussed potential for calcifications to cause pain and functional limitations.  - Treatment options discussed including subacromial injection, shockwave therapy, barbotage, surgical referral.   - He is interested in trial of cortisone injection  for inflammation and pain relief. Informed consent  obtained for cortisone injection. - Administered cortisone injection as noted below  - Advised relative rest and limitation of overhead activities for 1-2 weeks, then advance activity as tolerated - Could consider shockwave, barbotage, surgical referral if not improving.  PROCEDURE:  Risks & benefits of RT shoulder subacromial injection reviewed. Consent obtained. Time-out completed. Patient prepped and draped in the normal fashion. Area cleansed with alcohol. Ethyl chloride spray used to anesthetize the skin. Solution of 4 mL 1% lidocaine with 1 mL methylprednisolone (Depo-medrol) 40mg /mL injected into the RT subacromial space using a 25-gauge 1.5-inch needle via the posterior approach. Patient tolerated procedure well without any complications. Area covered with adhesive bandage. Post-procedure care reviewed. All questions answered.   Cervical spondylosis with radiculopathy Has known cervical spondylosis with radiculopathy, some of her symptoms could be radicular in nature versus from her shoulder - Consider further evaluation of the neck if limited improvement with interventions for her shoulder     Patient expressed understanding & agreement with above.  Encounter Diagnoses  Name Primary?   Calcific tendinitis of right shoulder Yes   Nontraumatic incomplete tear of right rotator cuff    Cervical spondylosis with radiculopathy     No orders of the defined types were placed in this encounter.    Contains text generated by [employer].

## 2024-04-30 NOTE — Patient Instructions (Signed)

## 2024-07-19 ENCOUNTER — Other Ambulatory Visit (HOSPITAL_BASED_OUTPATIENT_CLINIC_OR_DEPARTMENT_OTHER): Payer: Self-pay

## 2024-07-20 ENCOUNTER — Other Ambulatory Visit (HOSPITAL_BASED_OUTPATIENT_CLINIC_OR_DEPARTMENT_OTHER): Payer: Self-pay

## 2024-07-20 MED ORDER — MOUNJARO 12.5 MG/0.5ML ~~LOC~~ SOAJ
12.5000 mg | SUBCUTANEOUS | 1 refills | Status: AC
  Filled 2024-07-20: qty 2, 28d supply, fill #0
